# Patient Record
Sex: Female | Born: 1980 | Race: White | Hispanic: No | Marital: Married | Smoking: Never smoker
Health system: Southern US, Community
[De-identification: ages and names within clinical notes are randomized; demographics above are authoritative.]

## PROBLEM LIST (undated history)

## (undated) DIAGNOSIS — K219 Gastro-esophageal reflux disease without esophagitis: Secondary | ICD-10-CM

## (undated) DIAGNOSIS — Z789 Other specified health status: Secondary | ICD-10-CM

## (undated) HISTORY — PX: WISDOM TOOTH EXTRACTION: SHX21

## (undated) HISTORY — DX: Gastro-esophageal reflux disease without esophagitis: K21.9

---

## 2013-10-12 ENCOUNTER — Ambulatory Visit: Payer: Self-pay | Admitting: Family Medicine

## 2013-10-20 LAB — OB RESULTS CONSOLE RPR: RPR: NONREACTIVE

## 2013-10-20 LAB — OB RESULTS CONSOLE RUBELLA ANTIBODY, IGM: RUBELLA: IMMUNE

## 2013-10-20 LAB — OB RESULTS CONSOLE HEPATITIS B SURFACE ANTIGEN: Hepatitis B Surface Ag: NEGATIVE

## 2013-10-20 LAB — OB RESULTS CONSOLE HIV ANTIBODY (ROUTINE TESTING): HIV: NONREACTIVE

## 2013-11-11 ENCOUNTER — Ambulatory Visit (INDEPENDENT_AMBULATORY_CARE_PROVIDER_SITE_OTHER): Payer: 59 | Admitting: Internal Medicine

## 2013-11-11 ENCOUNTER — Encounter: Payer: Self-pay | Admitting: Internal Medicine

## 2013-11-11 VITALS — BP 120/68 | HR 121 | Temp 98.1°F | Ht 67.0 in | Wt 113.0 lb

## 2013-11-11 DIAGNOSIS — Z0000 Encounter for general adult medical examination without abnormal findings: Secondary | ICD-10-CM

## 2013-11-11 DIAGNOSIS — H547 Unspecified visual loss: Secondary | ICD-10-CM

## 2013-11-11 NOTE — Progress Notes (Signed)
Pre visit review using our clinic review tool, if applicable. No additional management support is needed unless otherwise documented below in the visit note. 

## 2013-12-06 ENCOUNTER — Encounter: Payer: Self-pay | Admitting: Internal Medicine

## 2013-12-06 NOTE — Progress Notes (Signed)
   Subjective:    Patient ID: Linda Sims, female    DOB: July 08, 1980, 33 y.o.   MRN: [id number]  HPI  New pt The patient is here for a wellness exam. The patient has been doing well overall without major physical or psychological issues going on lately. She is pregnant.  Review of Systems  Constitutional: Negative for fever, chills, diaphoresis, activity change, appetite change, fatigue and unexpected weight change.  HENT: Negative for congestion, dental problem, ear pain, hearing loss, mouth sores, postnasal drip, sinus pressure, sneezing, sore throat and voice change.   Eyes: Negative for pain and visual disturbance.  Respiratory: Negative for cough, chest tightness, wheezing and stridor.   Cardiovascular: Negative for chest pain, palpitations and leg swelling.  Gastrointestinal: Negative for nausea, vomiting, abdominal pain, blood in stool, abdominal distention and rectal pain.  Genitourinary: Negative for dysuria, frequency, hematuria, decreased urine volume, vaginal bleeding, vaginal discharge, difficulty urinating, vaginal pain and menstrual problem.  Musculoskeletal: Negative for arthralgias, back pain, gait problem, joint swelling and neck pain.  Skin: Negative for color change, rash and wound.  Neurological: Negative for dizziness, tremors, syncope, speech difficulty, weakness and light-headedness.  Hematological: Negative for adenopathy.  Psychiatric/Behavioral: Negative for suicidal ideas, hallucinations, behavioral problems, confusion, sleep disturbance, dysphoric mood and decreased concentration. The patient is not nervous/anxious and is not hyperactive.        Objective:   Physical Exam  Constitutional: She appears well-developed. No distress.  HENT:  Head: Normocephalic.  Right Ear: External ear normal.  Left Ear: External ear normal.  Nose: Nose normal.  Mouth/Throat: Oropharynx is clear and moist.  Eyes: Conjunctivae are normal. Pupils are equal, round, and reactive  to light. Right eye exhibits no discharge. Left eye exhibits no discharge.  Neck: Normal range of motion. Neck supple. No JVD present. No tracheal deviation present. No thyromegaly present.  Cardiovascular: Normal rate, regular rhythm and normal heart sounds.   Pulmonary/Chest: No stridor. No respiratory distress. She has no wheezes.  Abdominal: Soft. Bowel sounds are normal. She exhibits no distension and no mass. There is no tenderness. There is no rebound and no guarding.  Musculoskeletal: She exhibits no edema and no tenderness.  Lymphadenopathy:    She has no cervical adenopathy.  Neurological: She displays normal reflexes. No cranial nerve deficit. She exhibits normal muscle tone. Coordination normal.  Skin: No rash noted. No erythema.  Psychiatric: She has a normal mood and affect. Her behavior is normal. Judgment and thought content normal.    Labs reviewed      Assessment & Plan:

## 2013-12-06 NOTE — Assessment & Plan Note (Signed)
We discussed age appropriate health related issues, including available/recomended screening tests and vaccinations. We discussed a need for adhering to healthy diet and exercise. Labs/EKG were reviewed/ordered. All questions were answered.   

## 2014-02-10 LAB — OB RESULTS CONSOLE ANTIBODY SCREEN: Antibody Screen: NEGATIVE

## 2014-02-10 LAB — OB RESULTS CONSOLE ABO/RH: RH Type: NEGATIVE

## 2014-02-21 ENCOUNTER — Inpatient Hospital Stay (HOSPITAL_COMMUNITY): Admission: AD | Admit: 2014-02-21 | Payer: Self-pay | Source: Ambulatory Visit | Admitting: Obstetrics and Gynecology

## 2014-02-22 ENCOUNTER — Encounter: Payer: Self-pay | Admitting: Internal Medicine

## 2014-04-02 LAB — OB RESULTS CONSOLE GBS: GBS: NEGATIVE

## 2014-04-23 NOTE — L&D Delivery Note (Signed)
Pt pushed and "labored down". She developed exhaustion and asked for assistance. She had the VE placed at +_3 station in the OA position. She then delivered one live viable white female infant over a 2nd degree midline epis on the second ctx. There was one pop off. Nuchal cord x 1. Placenta S/I. EBL-400cc. Baby to NBN. Epis closed with 3-0 chromic and 2-0 vicryl.

## 2014-04-29 ENCOUNTER — Encounter (HOSPITAL_COMMUNITY): Payer: Self-pay | Admitting: *Deleted

## 2014-04-29 ENCOUNTER — Inpatient Hospital Stay (HOSPITAL_COMMUNITY)
Admission: AD | Admit: 2014-04-29 | Discharge: 2014-05-01 | DRG: 775 | Disposition: A | Discharge status: Home / Self Care | Payer: BLUE CROSS/BLUE SHIELD | Source: Ambulatory Visit | Attending: Obstetrics and Gynecology | Admitting: Obstetrics and Gynecology

## 2014-04-29 ENCOUNTER — Inpatient Hospital Stay (HOSPITAL_COMMUNITY): Payer: BLUE CROSS/BLUE SHIELD | Admitting: Anesthesiology

## 2014-04-29 ENCOUNTER — Inpatient Hospital Stay (HOSPITAL_COMMUNITY)
Admission: AD | Admit: 2014-04-29 | Discharge: 2014-04-29 | Disposition: A | Discharge status: Home / Self Care | Payer: BLUE CROSS/BLUE SHIELD | Source: Ambulatory Visit | Attending: Obstetrics and Gynecology | Admitting: Obstetrics and Gynecology

## 2014-04-29 DIAGNOSIS — O471 False labor at or after 37 completed weeks of gestation: Secondary | ICD-10-CM | POA: Insufficient documentation

## 2014-04-29 DIAGNOSIS — IMO0001 Reserved for inherently not codable concepts without codable children: Secondary | ICD-10-CM

## 2014-04-29 DIAGNOSIS — Z3480 Encounter for supervision of other normal pregnancy, unspecified trimester: Secondary | ICD-10-CM

## 2014-04-29 DIAGNOSIS — Z3A39 39 weeks gestation of pregnancy: Secondary | ICD-10-CM | POA: Insufficient documentation

## 2014-04-29 HISTORY — DX: Other specified health status: Z78.9

## 2014-04-29 LAB — CBC
HCT: 38.8 % (ref 36.0–46.0)
Hemoglobin: 13.8 g/dL (ref 12.0–15.0)
MCH: 32.5 pg (ref 26.0–34.0)
MCHC: 35.6 g/dL (ref 30.0–36.0)
MCV: 91.3 fL (ref 78.0–100.0)
Platelets: 192 10*3/uL (ref 150–400)
RBC: 4.25 MIL/uL (ref 3.87–5.11)
RDW: 12.3 % (ref 11.5–15.5)
WBC: 17.7 10*3/uL — AB (ref 4.0–10.5)

## 2014-04-29 MED ORDER — LIDOCAINE HCL (PF) 1 % IJ SOLN
INTRAMUSCULAR | Status: DC | PRN
  Administered 2014-04-29 (×2): 5 mL

## 2014-04-29 MED ORDER — OXYCODONE-ACETAMINOPHEN 5-325 MG PO TABS: 1.0000 | ORAL_TABLET | ORAL | Status: DC | PRN

## 2014-04-29 MED ORDER — BUTORPHANOL TARTRATE 1 MG/ML IJ SOLN: 1.0000 mg | INTRAMUSCULAR | Status: DC | PRN

## 2014-04-29 MED ORDER — CITRIC ACID-SODIUM CITRATE 334-500 MG/5ML PO SOLN: 30.0000 mL | ORAL | Status: DC | PRN

## 2014-04-29 MED ORDER — PHENYLEPHRINE 40 MCG/ML (10ML) SYRINGE FOR IV PUSH (FOR BLOOD PRESSURE SUPPORT): 80.0000 ug | PREFILLED_SYRINGE | INTRAVENOUS | Status: DC | PRN

## 2014-04-29 MED ORDER — FENTANYL 2.5 MCG/ML BUPIVACAINE 1/10 % EPIDURAL INFUSION (WH - ANES)
14.0000 mL/h | INTRAMUSCULAR | Status: DC | PRN
  Administered 2014-04-29 – 2014-04-30 (×2): 14 mL/h via EPIDURAL
  Filled 2014-04-29: qty 125

## 2014-04-29 MED ORDER — LACTATED RINGERS IV SOLN: 500.0000 mL | INTRAVENOUS | Status: DC | PRN

## 2014-04-29 MED ORDER — FENTANYL 2.5 MCG/ML BUPIVACAINE 1/10 % EPIDURAL INFUSION (WH - ANES)
INTRAMUSCULAR | Status: AC
  Administered 2014-04-29: 14 mL/h via EPIDURAL
  Filled 2014-04-29: qty 125

## 2014-04-29 MED ORDER — ONDANSETRON HCL 4 MG/2ML IJ SOLN: 4.0000 mg | Freq: Four times a day (QID) | INTRAMUSCULAR | Status: DC | PRN

## 2014-04-29 MED ORDER — LACTATED RINGERS IV SOLN: 500.0000 mL | Freq: Once | INTRAVENOUS | Status: DC

## 2014-04-29 MED ORDER — OXYTOCIN BOLUS FROM INFUSION
500.0000 mL | INTRAVENOUS | Status: DC
  Administered 2014-04-30: 500 mL via INTRAVENOUS

## 2014-04-29 MED ORDER — LIDOCAINE HCL (PF) 1 % IJ SOLN
30.0000 mL | INTRAMUSCULAR | Status: DC | PRN
  Filled 2014-04-29: qty 30

## 2014-04-29 MED ORDER — DIPHENHYDRAMINE HCL 50 MG/ML IJ SOLN: 12.5000 mg | INTRAMUSCULAR | Status: DC | PRN

## 2014-04-29 MED ORDER — EPHEDRINE 5 MG/ML INJ
10.0000 mg | INTRAVENOUS | Status: DC | PRN
Start: 2014-04-29 — End: 2014-04-30

## 2014-04-29 MED ORDER — EPHEDRINE 5 MG/ML INJ: 10.0000 mg | INTRAVENOUS | Status: DC | PRN

## 2014-04-29 MED ORDER — LACTATED RINGERS IV SOLN
INTRAVENOUS | Status: DC
  Administered 2014-04-29: 22:00:00 via INTRAVENOUS
  Administered 2014-04-30: 125 mL via INTRAVENOUS

## 2014-04-29 MED ORDER — PHENYLEPHRINE 40 MCG/ML (10ML) SYRINGE FOR IV PUSH (FOR BLOOD PRESSURE SUPPORT)
PREFILLED_SYRINGE | INTRAVENOUS | Status: AC
  Filled 2014-04-29: qty 20

## 2014-04-29 MED ORDER — ACETAMINOPHEN 325 MG PO TABS: 650.0000 mg | ORAL_TABLET | ORAL | Status: DC | PRN

## 2014-04-29 MED ORDER — OXYCODONE-ACETAMINOPHEN 5-325 MG PO TABS: 2.0000 | ORAL_TABLET | ORAL | Status: DC | PRN

## 2014-04-29 MED ORDER — OXYTOCIN 40 UNITS IN LACTATED RINGERS INFUSION - SIMPLE MED
62.5000 mL/h | INTRAVENOUS | Status: DC
  Administered 2014-04-30: 62.5 mL/h via INTRAVENOUS
  Filled 2014-04-29: qty 1000

## 2014-04-29 NOTE — Anesthesia Procedure Notes (Signed)
Epidural Patient location during procedure: OB Start time: 04/29/2014 10:54 PM  Staffing Anesthesiologist: Rudean Curt Performed by: anesthesiologist   Preanesthetic Checklist Completed: patient identified, site marked, surgical consent, pre-op evaluation, timeout performed, IV checked, risks and benefits discussed and monitors and equipment checked  Epidural Patient position: sitting Prep: site prepped and draped and DuraPrep Patient monitoring: continuous pulse ox and blood pressure Approach: midline Location: L3-L4 Injection technique: LOR air  Needle:  Needle type: Tuohy  Needle gauge: 17 G Needle length: 9 cm and 9 Needle insertion depth: 5 cm cm Catheter type: closed end flexible Catheter size: 19 Gauge Catheter at skin depth: 10 cm Test dose: negative  Assessment Events: blood not aspirated, injection not painful, no injection resistance, negative IV test and no paresthesia  Additional Notes Patient identified.  Risk benefits discussed including failed block, incomplete pain control, headache, nerve damage, paralysis, blood pressure changes, nausea, vomiting, reactions to medication both toxic or allergic, and postpartum back pain.  Patient expressed understanding and wished to proceed.  All questions were answered.  Sterile technique used throughout procedure and epidural site dressed with sterile barrier dressing. No paresthesia or other complications noted.The patient did not experience any signs of intravascular injection such as tinnitus or metallic taste in mouth nor signs of intrathecal spread such as rapid motor block. Please see nursing notes for vital signs.

## 2014-04-29 NOTE — H&P (Signed)
Linda Sims is a 34 y.o. female presenting for painful contraitons  35 yo G1P0 @ 39+3 presents for evaluation of painful contractions. The patient has been having painful contractions since 3 am this morning. She was seen in the office today and was 3/50/-2. She ccame to MAU a few hours later for evaluation and her cervix was unchanged. She was discharged home and now returns with persistant contractions that have become more painful. Her cervix is now 3-4/100/-1 with BBOW. Given the change will admit for labor.  Her pregnancy has been uncomplicated except for Rh negative status. Received rhogam on 02/09/14.  History OB History    Gravida Para Term Preterm AB TAB SAB Ectopic Multiple Living   1              Past Medical History  Diagnosis Date  . Medical history non-contributory    Past Surgical History  Procedure Laterality Date  . No past surgeries     Family History: family history is negative for Alcohol abuse, Arthritis, Asthma, Birth defects, Cancer, COPD, Depression, Diabetes, Drug abuse, Early death, Hearing loss, Heart disease, Hyperlipidemia, Hypertension, Kidney disease, Learning disabilities, Mental illness, Mental retardation, Miscarriages / Stillbirths, Stroke, Vision loss, and Varicose Veins. Social History:  reports that she has never smoked. She does not have any smokeless tobacco history on file. She reports that she does not drink alcohol or use illicit drugs.   Prenatal Transfer Tool  Maternal Diabetes: No Genetic Screening: Normal Maternal Ultrasounds/Referrals: Normal Fetal Ultrasounds or other Referrals:  None Maternal Substance Abuse:  No Significant Maternal Medications:  None Significant Maternal Lab Results:  Lab values include: Rh negative Other Comments:  None  ROS: as above  Dilation: 3.5 Effacement (%): 100 Station: -2, -1 Blood pressure 132/92, pulse 89, temperature 97.7 F (36.5 C), temperature source Oral, resp. rate 18, last menstrual period  07/27/2013. Exam Physical Exam  Prenatal labs: ABO, Rh: A/Negative/-- (10/21 0000) Antibody: Negative (10/21 0000) Rubella: Immune (06/30 0000) RPR: Nonreactive (06/30 0000)  HBsAg: Negative (06/30 0000)  HIV: Non-reactive (06/30 0000)  GBS: Negative (12/11 0000)   Assessment/Plan: 1) Admit 2) Epidural on request    Jayah Balthazar H. 04/29/2014, 9:08 PM

## 2014-04-29 NOTE — MAU Note (Signed)
Contractions every 5 minutes - was seen by MD 3 hours ago, SVE 3.5 cm's.  uc's are stronger, bloody show, denies LOF.

## 2014-04-29 NOTE — Discharge Instructions (Signed)
Natural Childbirth Natural childbirth is going through labor and delivery without any drugs to relieve pain. You also do not use fetal monitors, have a cesarean delivery, or get a sugical cut to enlarge the vaginal opening (episiotomy). With the help of a birthing professional (midwife), you will direct your own labor and delivery as you choose. Many women chose natural childbirth because they feel more in control and in touch with their labor and delivery. They are also concerned about the medications affecting themselves and the baby. Pregnant women with a high risk pregnancy should not attempt natural childbirth. It is better to deliver the infant in a hospital if an emergency situation arises. Sometimes, the caregiver has to intervene for the health and safety of the mother and infant. TWO TECHNIQUES FOR NATURAL CHILDBIRTH:   The Lamaze method. This method teaches women that having a baby is normal, healthy, and natural. It also teaches the mother to take a neutral position regarding pain medication and anesthesia and to make an informed decision if and when it is right for them.  The Hulan Fray (also called husband coached birth). This method teaches the father to be the birth coach and stresses a natural approach. It also encourages exercise and a balanced diet with good nutrition. The exercises teach relaxation and deep breathing techniques. However, there are also classes to prepare the parents for an emergency situation that may occur. METHODS OF DEALING WITH LABOR PAIN AND DELIVERY:  Meditation.  Yoga.  Hypnosis.  Acupuncture.  Massage.  Changing positions (walking, rocking, showering, leaning on birth balls).  Lying in warm water or a jacuzzi.  Find an activity that keeps your mind off of the labor pain.  Listen to soft music.  Visual imagery (focus on a particular object). BEFORE GOING INTO LABOR  Be sure you and your spouse/partner are in agreement to have natural  childbirth.  Decide if your caregiver or a midwife will deliver your baby.  Decide if you will have your baby in the hospital, birthing center, or at home.  If you have children, make plans to have someone to take care of them when you go to the hospital.  Know the distance and the time it takes to go to the delivery center. Make a dry run to be sure.  Have a bag packed with a night gown, bathrobe, and toiletries ready to take when you go into labor.  Keep phone numbers of your family and friends handy if you need to call someone when you go into labor.  Your spouse or partner should go to all the teaching classes.  Talk with your caregiver about the possibility of a medical emergency and what will happen if that occurs. ADVANTAGES OF NATURAL CHILDBIRTH  You are in control of your labor and delivery.  It is safe.  There are no medications or anesthetics that may affect you and the fetus.  There are no invasive procedures such as an episiotomy.  You and your partner will work together, which can increase your bond.  Meditation, yoga, massage, and breathing exercises can be learned while pregnant and help you when you are in labor and at delivery.  In most delivery centers, the family and friends can be involved in the labor and delivery process. DISADVANTAGES OF NATURAL CHILDBIRTH  You will experience pain during your labor and delivery.  The methods of helping relieve your labor pains may not work for you.  You may feel embarrassed, disappointed, and like a failure  if you decide to change your mind during labor and not have natural childbirth. AFTER THE DELIVERY  You will be very tired.  You will be uncomfortable because of your uterus contracting. You will feel soreness around the vagina.  You may feel cold and shaky.This is a natural reaction.  You will be excited, overwhelmed, accomplished, and proud to be a mother. HOME CARE INSTRUCTIONS   Follow the advice and  instructions of your caregiver.  Follow the instructions of your natural childbirth instructor (Lamaze or [place]). Document Released: 03/22/2008 Document Revised: 07/02/2011 Document Reviewed: 12/15/2012 [facility] Patient Information 2015 [place], [place]. This information is not intended to replace advice given to you by your health care provider. Make sure you discuss any questions you have with your health care provider.

## 2014-04-29 NOTE — Anesthesia Preprocedure Evaluation (Signed)
Anesthesia Evaluation  Patient identified by MRN, date of birth, ID band Patient awake    Reviewed: Allergy & Precautions, H&P , Patient's Chart, lab work & pertinent test results  Airway Mallampati: II  TM Distance: >3 FB Neck ROM: full    Dental   Pulmonary  breath sounds clear to auscultation        Cardiovascular Rhythm:regular Rate:Normal     Neuro/Psych    GI/Hepatic   Endo/Other    Renal/GU      Musculoskeletal   Abdominal   Peds  Hematology   Anesthesia Other Findings   Reproductive/Obstetrics (+) Pregnancy                             Anesthesia Physical Anesthesia Plan  ASA: II  Anesthesia Plan: Epidural   Post-op Pain Management:    Induction:   Airway Management Planned:   Additional Equipment:   Intra-op Plan:   Post-operative Plan:   Informed Consent: I have reviewed the patients History and Physical, chart, labs and discussed the procedure including the risks, benefits and alternatives for the proposed anesthesia with the patient or authorized representative who has indicated his/her understanding and acceptance.     Plan Discussed with:   Anesthesia Plan Comments:         Anesthesia Quick Evaluation

## 2014-04-29 NOTE — MAU Note (Signed)
Pt reports ctx stronger and closer together. Here earlier this afternoon for labor eval. Denies SROM or bleeding at this time

## 2014-04-29 NOTE — MAU Note (Signed)
Urine in lab 

## 2014-04-30 ENCOUNTER — Encounter (HOSPITAL_COMMUNITY): Payer: Self-pay | Admitting: *Deleted

## 2014-04-30 DIAGNOSIS — Z3480 Encounter for supervision of other normal pregnancy, unspecified trimester: Secondary | ICD-10-CM

## 2014-04-30 MED ORDER — ZOLPIDEM TARTRATE 5 MG PO TABS
5.0000 mg | ORAL_TABLET | Freq: Every evening | ORAL | Status: DC | PRN
Start: 2014-04-30 — End: 2014-05-01

## 2014-04-30 MED ORDER — WITCH HAZEL-GLYCERIN EX PADS: 1.0000 "application " | MEDICATED_PAD | CUTANEOUS | Status: DC | PRN

## 2014-04-30 MED ORDER — ONDANSETRON HCL 4 MG PO TABS: 4.0000 mg | ORAL_TABLET | ORAL | Status: DC | PRN

## 2014-04-30 MED ORDER — SENNOSIDES-DOCUSATE SODIUM 8.6-50 MG PO TABS
2.0000 | ORAL_TABLET | ORAL | Status: DC
  Administered 2014-05-01: 2 via ORAL
  Filled 2014-04-30: qty 2

## 2014-04-30 MED ORDER — TERBUTALINE SULFATE 1 MG/ML IJ SOLN: 0.2500 mg | Freq: Once | INTRAMUSCULAR | Status: DC | PRN

## 2014-04-30 MED ORDER — IBUPROFEN 600 MG PO TABS
600.0000 mg | ORAL_TABLET | Freq: Four times a day (QID) | ORAL | Status: DC
  Administered 2014-04-30 – 2014-05-01 (×4): 600 mg via ORAL
  Filled 2014-04-30 (×4): qty 1

## 2014-04-30 MED ORDER — DIBUCAINE 1 % RE OINT: 1.0000 "application " | TOPICAL_OINTMENT | RECTAL | Status: DC | PRN

## 2014-04-30 MED ORDER — MEASLES, MUMPS & RUBELLA VAC ~~LOC~~ INJ: 0.5000 mL | INJECTION | Freq: Once | SUBCUTANEOUS | Status: DC

## 2014-04-30 MED ORDER — OXYTOCIN 40 UNITS IN LACTATED RINGERS INFUSION - SIMPLE MED
1.0000 m[IU]/min | INTRAVENOUS | Status: DC
  Administered 2014-04-30: 2 m[IU]/min via INTRAVENOUS

## 2014-04-30 MED ORDER — OXYCODONE-ACETAMINOPHEN 5-325 MG PO TABS: 2.0000 | ORAL_TABLET | ORAL | Status: DC | PRN

## 2014-04-30 MED ORDER — SIMETHICONE 80 MG PO CHEW
80.0000 mg | CHEWABLE_TABLET | ORAL | Status: DC | PRN
Start: 2014-04-30 — End: 2014-05-01

## 2014-04-30 MED ORDER — BENZOCAINE-MENTHOL 20-0.5 % EX AERO
1.0000 "application " | INHALATION_SPRAY | CUTANEOUS | Status: DC | PRN
  Filled 2014-04-30: qty 56

## 2014-04-30 MED ORDER — ONDANSETRON HCL 4 MG/2ML IJ SOLN: 4.0000 mg | INTRAMUSCULAR | Status: DC | PRN

## 2014-04-30 MED ORDER — TETANUS-DIPHTH-ACELL PERTUSSIS 5-2.5-18.5 LF-MCG/0.5 IM SUSP: 0.5000 mL | Freq: Once | INTRAMUSCULAR | Status: DC

## 2014-04-30 MED ORDER — OXYCODONE-ACETAMINOPHEN 5-325 MG PO TABS
1.0000 | ORAL_TABLET | ORAL | Status: DC | PRN
  Administered 2014-04-30: 1 via ORAL
  Filled 2014-04-30: qty 1

## 2014-04-30 NOTE — Progress Notes (Signed)
Dr. Harrington Challenger would like patient to labor down if tolerated.

## 2014-04-30 NOTE — Progress Notes (Signed)
Infant skin to skin with mom

## 2014-04-30 NOTE — Progress Notes (Signed)
Infant skin to skin with father

## 2014-05-01 LAB — RPR: RPR: NONREACTIVE — AB

## 2014-05-01 MED ORDER — OXYCODONE-ACETAMINOPHEN 5-325 MG PO TABS: 2.0000 | ORAL_TABLET | ORAL | Status: DC | PRN

## 2014-05-01 NOTE — Lactation Note (Signed)
This note was copied from the chart of Linda Andorra. Lactation Consultation Note; Initial visit with mom. She is latching baby as I went into room. Reports some tenderness but baby has been nursing a lot. Reports she has been leaking Colostrum for several Linda Sims. BF brochure given with resources for support after DC. No questions at present. To call prn  Patient Name: Linda Sims Today's Date: 05/01/2014 Reason for consult: Initial assessment   Maternal Data Formula Feeding for Exclusion: No Has patient been taught Hand Expression?: Yes Does the patient have breastfeeding experience prior to this delivery?: No  Feeding Feeding Type: Breast Fed Length of feed: 30 min  LATCH Score/Interventions Latch: Grasps breast easily, tongue down, lips flanged, rhythmical sucking.  Audible Swallowing: A few with stimulation  Type of Nipple: Everted at rest and after stimulation  Comfort (Breast/Nipple): Soft / non-tender     Hold (Positioning): Assistance needed to correctly position infant at breast and maintain latch.  LATCH Score: 8  Lactation Tools Discussed/Used     Consult Status Consult Status: Complete    Truddie Crumble 05/01/2014, 10:47 AM

## 2014-05-01 NOTE — Progress Notes (Signed)
PPD#1 Pt without complaints. She wants to go home. States Peds gave the ok. Lochia - mild VSSAF IMP/ Doing well PLAN/ Will discharge to home.

## 2014-05-01 NOTE — Progress Notes (Signed)
Patient given and educated on use of sitz bath.

## 2014-05-01 NOTE — Anesthesia Postprocedure Evaluation (Signed)
Anesthesia Post Note  Patient: Linda Sims  Procedure(s) Performed: * No procedures listed *  Anesthesia type: Epidural  Patient location: Mother/Baby  Post pain: Pain level controlled  Post assessment: Post-op Vital signs reviewed  Last Vitals:  Filed Vitals:   05/01/14 0635  BP: 99/57  Pulse: 74  Temp: 36.4 C  Resp: 18    Post vital signs: Reviewed  Level of consciousness: awake  Complications: No apparent anesthesia complications

## 2014-05-01 NOTE — Discharge Summary (Signed)
Obstetric Discharge Summary Reason for Admission: onset of labor Prenatal Procedures: ultrasound Intrapartum Procedures: vacuum Postpartum Procedures: none Complications-Operative and Postpartum: 2nd degree perineal laceration HEMOGLOBIN  Date Value Ref Range Status  04/29/2014 13.8 12.0 - 15.0 g/dL Final   HCT  Date Value Ref Range Status  04/29/2014 38.8 36.0 - 46.0 % Final    Physical Exam:  General: alert Lochia: appropriate Uterine Fundus: firm   Discharge Diagnoses: Term Pregnancy-delivered  Discharge Information: Date: 05/01/2014 Activity: pelvic rest Diet: routine Medications: PNV, Ibuprofen and Percocet Condition: stable Instructions: refer to practice specific booklet Discharge to: home Follow-up Information    Follow up with Olga Millers, MD. Schedule an appointment as soon as possible for a visit in 1 month.   Specialty:  Obstetrics and Gynecology   Contact information:   [place] [address] [phone]       Newborn Data: Live born female  Birth Weight: 6 lb 6.8 oz (2914 g) APGAR: 8, 9  Home with mother.  ANDERSON,MARK E 05/01/2014, 11:06 AM

## 2014-05-02 ENCOUNTER — Ambulatory Visit: Payer: Self-pay

## 2014-05-02 NOTE — Lactation Note (Signed)
This note was copied from the chart of Linda Andorra. Lactation Consultation Note  Patient Name: Linda Sims [id number] Date: 05/02/2014 Reason for consult: Follow-up assessment;Infant weight loss   Follow-up consult at 40 hours age; infant has had an 8% weight loss from BW.  Infant has breastfed x17 (15-45 ml) in past 24 hours; voids-1 in past 24 hours/ 3 life; stools -9 in 24 hours/ 12 life.   Peds Provider requested supplementing with 15 ml formula or EBM if available with each feeding using an SNS; infant will follow-up with Peds tomorrow.   When Fabens entered room infant had been feeding for 15 minutes on left side in cradle position.  Mom demonstrated hand expression with observation of colostrum easily expressed from breast.  Parents consented to trying SNS at current feeding for teaching purposes even though baby was sleepy.   LC taught dad how to set up SNS with dad setting it up himself and discussed how to clean SNS.  Parents were able to re-awaken infant to get latched for SNS feeding.   Mom was latching using a cradle hold on left side and shallow latch.  Taught mom how to use cross-cradle hold, sandwiching of breast, and discussed importance of latching with depth with flanged lips and wide mouth.  Educated on importance of depth in transferring milk and thus maintaining milk supply.  Infant took 37ml of Alimentum formula via SNS and then went to sleep.    Mom has DEBP at home; encouraged pumping after each feeding during the day and using EBM pumped for next SNS supplementation as milk volume increases..  Parents feel good about supplementation feeding plan and will follow-up with peds tomorrow.  Encouraged to call for questions as needed after discharge.  Informed of outpatient consultation services if needed.     Maternal Data    Feeding Feeding Type: Formula Length of feed: 25 min  LATCH Score/Interventions Latch: Grasps breast easily, tongue down, lips flanged, rhythmical  sucking.  Audible Swallowing: A few with stimulation  Type of Nipple: Everted at rest and after stimulation  Comfort (Breast/Nipple): Filling, red/small blisters or bruises, mild/mod discomfort  Problem noted: Mild/Moderate discomfort Interventions (Mild/moderate discomfort): Hand expression  Hold (Positioning): No assistance needed to correctly position infant at breast. Intervention(s): Breastfeeding basics reviewed  LATCH Score: 8  Lactation Tools Discussed/Used Pump Review: Setup, frequency, and cleaning;Milk Storage   Consult Status Consult Status: Complete    Merlene Laughter 05/02/2014, 12:40 PM

## 2014-05-03 LAB — TYPE AND SCREEN
ABO/RH(D): A NEG
Antibody Screen: POSITIVE
Unit division: 0
Unit division: 0

## 2014-05-04 ENCOUNTER — Ambulatory Visit (HOSPITAL_COMMUNITY)
Admission: RE | Admit: 2014-05-04 | Discharge: 2014-05-04 | Disposition: A | Discharge status: Home / Self Care | Payer: BLUE CROSS/BLUE SHIELD | Source: Ambulatory Visit | Attending: Obstetrics and Gynecology | Admitting: Obstetrics and Gynecology

## 2014-05-04 NOTE — Lactation Note (Signed)
Lactation Consult  Mother's reason for visit:  Feeding assessment Visit Type:  OP Appointment Notes:  Linda Sims and Linda Sims are here today for a feeding assessment.  Linda Sims has resolving jaundiced and mom has resolving engorgement.  Maria ate on the left side first and quickly became sleepy.  She did not soften the breast but ate 24 ml.  She was placed on the right breast in a cross cradle hold. She suckled much deeper this position and transferred 18 ml.  She was still rooting so we repositioned her in a football position on the left breast and she transferred another 12 ml. Longer jaw excursions were noted with this position and she took more milk from the breast.  I talked to mom about laid back nursing and tummy time to help baby stretch any upper body muscles that may be affecting suckling. We discussed using jaw massage, laid back nursing and tummy time to help with breast feeding.  I also recommended that mom post-pump 4 times a day to aid in optimal milk production.  Her upper lip frenum does have some restriction. Support group encouraged for weight checks Consult:  Initial Lactation Consultant:  Van Clines  ________________________________________________________________________  [name] Name: Curt Jews Date of Birth: 04/30/2012 Pediatrician: Karsten Ro Gender: female Gestational Age: [redacted]w[redacted]d (At Birth) Birth Weight: 6 lb 6.8 oz (2914 g) Weight at Discharge: Weight: 5 lb 15 oz (2693 g)Date of Discharge: 05/02/2014 Filed Weights   04/30/14 1057 05/01/14 0000 05/02/14 0813  Weight: 6 lb 6.8 oz (2914 g) 6 lb 5.4 oz (2875 g) 5 lb 15 oz (2693 g)   Last weight taken from location outside of [facility]: 6+2 Location:Pediatrician's office Weight today: 2846 prefeed 6# 4 oz at 4 days of life     2870 post feed  ________________________________________________________________________  Mother's Name: Mendel Corning Type of delivery:  Vaginal Breastfeeding  Experience:  First baby Maternal Medical Conditions:  NA Maternal Medications:  PNV, Advils  ________________________________________________________________________  Breastfeeding History (Post Discharge)  Frequency of breastfeeding:  Every 2 - 2.5 hours Infant Intake and Output Assessment  Voids:  4-5 in 24 hrs.  Color:  Clear yellow Stools:  10 in 24 hrs.  Color:  Yellow  ________________________________________________________________________  Maternal Breast Assessment  Breast:  Full Nipple:  Erect and Scabs Pain level:  0 when wearing shells Pain interventions:  Inverted shells  _______________________________________________________________________

## 2014-05-07 ENCOUNTER — Ambulatory Visit (HOSPITAL_COMMUNITY): Payer: BLUE CROSS/BLUE SHIELD

## 2014-05-19 ENCOUNTER — Ambulatory Visit (HOSPITAL_COMMUNITY)
Admission: RE | Admit: 2014-05-19 | Discharge: 2014-05-19 | Disposition: A | Discharge status: Home / Self Care | Payer: BLUE CROSS/BLUE SHIELD | Source: Ambulatory Visit | Attending: Obstetrics and Gynecology | Admitting: Obstetrics and Gynecology

## 2014-05-19 NOTE — Lactation Note (Signed)
Lactation Consult  Mother's reason for visit: per mom milk is stuck on the breast , right breast isn't emptying, and feels full after feeding .  Per mom today's temp 99.5 at 12n , at 3pm 101 , and yesterday 102. , chills for the last 3 days, denies flu like symptoms. And headache for 2 days. Plugged started 2 days ago in the evening.  Visit Type: Feeding assessment due to plugged duct  Appointment Notes: Plugged duct left breast , resolving,  Consult:  Followup  Lactation Consultant:  Myer Haff  ________________________________________________________________________ Linda Sims Name: Linda Sims Date of Birth: 04/30/2012 Pediatrician:Dr. Karsten Ro  Gender: female Gestational Age: [redacted]w[redacted]d (At Birth) Birth Weight: 6 lb 6.8 oz (2914 g) Weight at Discharge: Weight: 5 lb 15 oz (2693 g)Date of Discharge: 05/02/2014 Filed Weights   04/30/14 1057 05/01/14 0000 05/02/14 0813  Weight: 6 lb 6.8 oz (2914 g) 6 lb 5.4 oz (2875 g) 5 lb 15 oz (2693 g)   Last weight taken from location outside of [facility]: 7-2 oz 1/21  Location:Pediatrician's office Weight today:3480 g , 7-10 .7 oz     ________________________________________________________________________  Mother's Name: Mendel Corning Type of delivery:  Vaginal Delivery  Breastfeeding Experience:  2 1/2 weeks , 1st time mother  Maternal Medical Conditions:  No risk  Maternal Medications:  PNV   ________________________________________________________________________  Breastfeeding History (Post Discharge)  Frequency of breastfeeding: every 2 to 2 1/2 hours  Duration of feeding:  20 -30 mins   Supplementing; none   Pumping : per mom DEBP - 3-4 times per day after feedings , 1-2 oz at one pumping session.  Infant Intake and Output Assessment  Voids:  10-15 in 24 hClear yellow Stools:  10 -12 hrs.  Color:  Yellow  ________________________________________________________________________  Maternal  Breast Assessment - both breast full , nipples appear healthy pink, no plugs noted on the left .                                                       On the right breast noted a warm to hot pinkish area on the 10 'oclock area above the areola                                                        Per mom and dad the pinkish area was from where mom was placing a very warm wash cloth on the breast to increase let down.                                                       Pinkish area still warm after the feeding , also LC noted a plug on the underneath portion of the breast mid-lateral aspect .  Per mom very tender to touch when LC was assessing breast. Mom had mentioned the resolving plug was on the inner aspect of the                                                        Near the 3 o'clock position. Has resolved , no sign of plug , or any pink reddened area.                                                       LC 's concerns is mom having a HA for 2 days , plugged ducts for 2 days, and an elevated Temp up to 102 and chills.  Breast:  Full Nipple:  Erect Pain level:  0 Pain interventions:  Expressed breast milk  _______________________________________________________________________ Feeding Assessment/Evaluation  Initial feeding assessment: Baby alert and calm, content.   Infant's oral assessment:  Variance- short labial frenulum above gum line and upper lip stretches well to flanged position. Short anterior frenulum and high palate.    Positioning:  Football Right breast  LATCH documentation:  Latch:  2 = Grasps breast easily, tongue down, lips flanged, rhythmical sucking.  Audible swallowing:  2 = Spontaneous and intermittent  Type of nipple:  2 = Everted at rest and after stimulation  Comfort (Breast/Nipple):  1 = Filling, red/small blisters or bruises, mild/mod discomfort  Hold (Positioning):  1 = Assistance needed to  correctly position infant at breast and maintain latch  LATCH score:  8  Attached assessment:  Deep  Lips flanged:  Yes.    Lips untucked:  Yes.    Suck assessment:  Nutritive  Tools:  None  Instructed on use and cleaning of tool:  No.  Pre-feed weight:  3480 g , 7-10.7 oz  Post-feed weight:  3498 g , 7-11.4 oz  Amount transferred:  18 ml  , ( off the right breast ) , per mom also fed the baby 10 mins at 315 p , 45 mins prior to [place] apt. Amount supplemented:  None   Additional Feeding Assessment -   Infant's oral assessment:  Variance, see above note   Positioning:  Football Left breast  LATCH documentation:  Latch:  2 = Grasps breast easily, tongue down, lips flanged, rhythmical sucking.  Audible swallowing:  2 = Spontaneous and intermittent  Type of nipple:  1 = Flat  Comfort (Breast/Nipple):  1 = Filling, red/small blisters or bruises, mild/mod discomfort  Hold (Positioning):  2 = No assistance needed to correctly position infant at breast  LATCH score:  8   Attached assessment:  Shallow , LC assisted with depth , increased swallows noted   Lips flanged:  Yes.    Lips untucked:  Yes.    Suck assessment:  Nutritive  Tools:  None  Instructed on use and cleaning of tool:  No.  Pre-feed weight:  3498 g , 7-11.4 oz  Post-feed weight:  3566 g, 7-13.8 oz  Amount transferred:  68 ml  Amount supplemented:  None    Total amount pumped post feed:  None needed - Baby breast fed both breast   Total amount transferred: 40  ml  Total supplement given:  None   Lactation Impression:  Breast feeding - latching wises is going well both breast ( even though baby has a short frenulum ) and gaining well. Mom has a quick Let down , and the baby stays in a consistent swallowing pattern , 86  ml transferred  off the breast at consult ( only 18 ml off the right effected area , and 68 ml off the left )  Questionable how long the plugged duct issue has been going on to see such a  difference in transfer. Due to moms S/S , especially the elevated temp , chills - feel this mom should be tx for mastitis. Praised mom  For calling the [facility] office early for apt to have issue assessed.   Lactation Plan of care Continue to feed every 2-3 hours and with feeding cues. Growth spurts 2 1/2 - 3 weeks , 6 weeks , - cluster feeding is normal  Breast feed at least 2 different positions  Prevention of engorgement , plugged ducts , and tx of Mastitis  Tx - of mastitis - DR. TO CALL IN ANTIBIOTIC PRESCRIPTION FOR 10 DAYS , IF TEMP DOESN'T DECREASE IN 55 HOURS - CALL DR, OFFICE BACK. iF BREAST ARE FULL - FEED THE BABY , IF TO FULL HAND EXPRESS, FEED THE BABY , ALWAYS SOFTEN 1ST BREAST WELL PRIOR TO OFFERING THE 2ND BREAST  BREAST GREATER THAN FULL , FEELING TIGHT , NEED TO RELEASE BEFORE FEEDING HAND EXPRESSING OR PUMPING OFF 3-5 MINS . Breast are firm to hard need to ice 15 -20 mins then massage , hand express, pre-pump if needed and feed the baby.  If the breast are not letting down well , warm wash cloth above areola while the baby is feeding ( just warm not hot )  Pump as needed

## 2015-03-22 ENCOUNTER — Encounter: Payer: BLUE CROSS/BLUE SHIELD | Admitting: Internal Medicine

## 2015-04-04 ENCOUNTER — Ambulatory Visit (INDEPENDENT_AMBULATORY_CARE_PROVIDER_SITE_OTHER): Payer: BLUE CROSS/BLUE SHIELD | Admitting: Internal Medicine

## 2015-04-04 ENCOUNTER — Other Ambulatory Visit (INDEPENDENT_AMBULATORY_CARE_PROVIDER_SITE_OTHER): Payer: BLUE CROSS/BLUE SHIELD

## 2015-04-04 ENCOUNTER — Encounter: Payer: Self-pay | Admitting: Internal Medicine

## 2015-04-04 VITALS — BP 114/72 | HR 81 | Ht 67.0 in | Wt 122.0 lb

## 2015-04-04 DIAGNOSIS — Z0000 Encounter for general adult medical examination without abnormal findings: Secondary | ICD-10-CM

## 2015-04-04 DIAGNOSIS — Z23 Encounter for immunization: Secondary | ICD-10-CM

## 2015-04-04 LAB — URINALYSIS
Bilirubin Urine: NEGATIVE
Hgb urine dipstick: NEGATIVE
KETONES UR: NEGATIVE
LEUKOCYTES UA: NEGATIVE
Nitrite: NEGATIVE
Total Protein, Urine: NEGATIVE
UROBILINOGEN UA: 0.2 (ref 0.0–1.0)
Urine Glucose: NEGATIVE
pH: 6 (ref 5.0–8.0)

## 2015-04-04 LAB — HEPATIC FUNCTION PANEL
ALK PHOS: 37 U/L — AB (ref 39–117)
ALT: 14 U/L (ref 0–35)
AST: 14 U/L (ref 0–37)
Albumin: 4.6 g/dL (ref 3.5–5.2)
BILIRUBIN DIRECT: 0.1 mg/dL (ref 0.0–0.3)
Total Bilirubin: 0.6 mg/dL (ref 0.2–1.2)
Total Protein: 7 g/dL (ref 6.0–8.3)

## 2015-04-04 LAB — CBC WITH DIFFERENTIAL/PLATELET
Basophils Absolute: 0 10*3/uL (ref 0.0–0.1)
Basophils Relative: 0.3 % (ref 0.0–3.0)
Eosinophils Absolute: 0 10*3/uL (ref 0.0–0.7)
Eosinophils Relative: 0.6 % (ref 0.0–5.0)
HEMATOCRIT: 40.6 % (ref 36.0–46.0)
Hemoglobin: 13.8 g/dL (ref 12.0–15.0)
LYMPHS ABS: 2.4 10*3/uL (ref 0.7–4.0)
LYMPHS PCT: 36 % (ref 12.0–46.0)
MCHC: 33.9 g/dL (ref 30.0–36.0)
MCV: 90.9 fl (ref 78.0–100.0)
MONOS PCT: 8.5 % (ref 3.0–12.0)
Monocytes Absolute: 0.6 10*3/uL (ref 0.1–1.0)
NEUTROS ABS: 3.7 10*3/uL (ref 1.4–7.7)
Neutrophils Relative %: 54.6 % (ref 43.0–77.0)
PLATELETS: 220 10*3/uL (ref 150.0–400.0)
RBC: 4.47 Mil/uL (ref 3.87–5.11)
RDW: 12.2 % (ref 11.5–15.5)
WBC: 6.8 10*3/uL (ref 4.0–10.5)

## 2015-04-04 LAB — BASIC METABOLIC PANEL
BUN: 11 mg/dL (ref 6–23)
CALCIUM: 9.8 mg/dL (ref 8.4–10.5)
CO2: 31 mEq/L (ref 19–32)
Chloride: 106 mEq/L (ref 96–112)
Creatinine, Ser: 0.98 mg/dL (ref 0.40–1.20)
GFR: 69.8 mL/min (ref >60.00)
Glucose, Bld: 98 mg/dL (ref 70–99)
Potassium: 5 mEq/L (ref 3.5–5.1)
SODIUM: 142 meq/L (ref 135–145)

## 2015-04-04 LAB — TSH: TSH: 0.83 u[IU]/mL (ref 0.35–4.50)

## 2015-04-04 MED ORDER — VITAMIN D3 50 MCG (2000 UT) PO CAPS: 2000.0000 [IU] | ORAL_CAPSULE | Freq: Every day | ORAL | Status: DC

## 2015-04-04 NOTE — Progress Notes (Signed)
Pre visit review using our clinic review tool, if applicable. No additional management support is needed unless otherwise documented below in the visit note. 

## 2015-04-04 NOTE — Assessment & Plan Note (Signed)
We discussed age appropriate health related issues, including available/recomended screening tests and vaccinations. We discussed a need for adhering to healthy diet and exercise.All questions were answered.    

## 2015-04-04 NOTE — Progress Notes (Signed)
Subjective:  Patient ID: Linda Sims, female    DOB: [birth year]  Age: 34 y.o. MRN: [id number]  CC: Annual Exam   HPI Linda Sims presents for a well exam  Outpatient Prescriptions Prior to Visit  Medication Sig Dispense Refill  . Omega-3 Fatty Acids (FISH OIL PO) Take 1 capsule by mouth daily.    Marland Kitchen oxyCODONE-acetaminophen (PERCOCET/ROXICET) 5-325 MG per tablet Take 2 tablets by mouth every 4 (four) hours as needed (for pain scale equal to or greater than 7). 30 tablet 0  . Prenatal Vit-Fe Fumarate-FA (PRENATAL PO) Take 1 tablet by mouth daily.      No facility-administered medications prior to visit.    ROS Review of Systems  Constitutional: Negative for chills, activity change, appetite change, fatigue and unexpected weight change.  HENT: Negative for congestion, mouth sores and sinus pressure.   Eyes: Negative for visual disturbance.  Respiratory: Negative for cough, chest tightness and shortness of breath.   Gastrointestinal: Negative for nausea and abdominal pain.  Genitourinary: Negative for frequency, difficulty urinating and vaginal pain.  Musculoskeletal: Negative for back pain and gait problem.  Skin: Negative for pallor and rash.  Neurological: Negative for dizziness, tremors, weakness, numbness and headaches.  Psychiatric/Behavioral: Negative for suicidal ideas, confusion and sleep disturbance. The patient is not nervous/anxious.     Objective:  BP 114/72 mmHg  Pulse 81  Ht 5\' 7"  (1.702 m)  Wt 122 lb (55.339 kg)  BMI 19.10 kg/m2  SpO2 97%  BP Readings from Last 3 Encounters:  04/04/15 114/72  05/01/14 99/57  04/29/14 138/87    Wt Readings from Last 3 Encounters:  04/04/15 122 lb (55.339 kg)  04/29/14 144 lb 3.2 oz (65.409 kg)  11/11/13 113 lb (51.256 kg)    Physical Exam  Constitutional: She appears well-developed. No distress.  HENT:  Head: Normocephalic.  Right Ear: External ear normal.  Left Ear: External ear normal.  Nose: Nose normal.    Mouth/Throat: Oropharynx is clear and moist.  Eyes: Conjunctivae are normal. Pupils are equal, round, and reactive to light. Right eye exhibits no discharge. Left eye exhibits no discharge.  Neck: Normal range of motion. Neck supple. No JVD present. No tracheal deviation present. No thyromegaly present.  Cardiovascular: Normal rate, regular rhythm and normal heart sounds.   Pulmonary/Chest: No stridor. No respiratory distress. She has no wheezes.  Abdominal: Soft. Bowel sounds are normal. She exhibits no distension and no mass. There is no tenderness. There is no rebound and no guarding.  Musculoskeletal: She exhibits no edema or tenderness.  Lymphadenopathy:    She has no cervical adenopathy.  Neurological: She displays normal reflexes. No cranial nerve deficit. She exhibits normal muscle tone. Coordination normal.  Skin: No rash noted. No erythema.  Psychiatric: She has a normal mood and affect. Her behavior is normal. Judgment and thought content normal.    Lab Results  Component Value Date   WBC 6.8 04/04/2015   HGB 13.8 04/04/2015   HCT 40.6 04/04/2015   PLT 220.0 04/04/2015   GLUCOSE 98 04/04/2015   ALT 14 04/04/2015   AST 14 04/04/2015   NA 142 04/04/2015   K 5.0 04/04/2015   CL 106 04/04/2015   CREATININE 0.98 04/04/2015   BUN 11 04/04/2015   CO2 31 04/04/2015   TSH 0.83 04/04/2015    No results found.  Assessment & Plan:   Julee was seen today for annual exam.  Diagnoses and all orders for this visit:  Well adult exam -  Basic metabolic panel; Future -     Hepatic function panel; Future -     CBC with Differential/Platelet; Future -     TSH; Future -     Urinalysis; Future  Need for influenza vaccination -     Flu Vaccine QUAD 36+ mos IM  Other orders -     Cholecalciferol (VITAMIN D3) 2000 UNITS capsule; Take 1 capsule (2,000 Units total) by mouth daily.  I have discontinued Ms. Tuckett's oxyCODONE-acetaminophen. I am also having her start on Vitamin  D3. Additionally, I am having her maintain her Prenatal Vit-Fe Fumarate-FA (PRENATAL PO) and Omega-3 Fatty Acids (FISH OIL PO).  Meds ordered this encounter  Medications  . Cholecalciferol (VITAMIN D3) 2000 UNITS capsule    Sig: Take 1 capsule (2,000 Units total) by mouth daily.    Dispense:  100 capsule    Refill:  3     Follow-up: Return in about 1 year (around 04/03/2016) for Wellness Exam.  Walker Kehr, MD

## 2016-03-06 ENCOUNTER — Ambulatory Visit (INDEPENDENT_AMBULATORY_CARE_PROVIDER_SITE_OTHER): Payer: BLUE CROSS/BLUE SHIELD

## 2016-03-06 DIAGNOSIS — Z23 Encounter for immunization: Secondary | ICD-10-CM | POA: Diagnosis not present

## 2016-04-04 ENCOUNTER — Encounter: Payer: BLUE CROSS/BLUE SHIELD | Admitting: Internal Medicine

## 2018-06-17 LAB — HM PAP SMEAR: HM Pap smear: NORMAL

## 2018-06-19 ENCOUNTER — Encounter: Payer: Self-pay | Admitting: Internal Medicine

## 2018-06-19 NOTE — Progress Notes (Unsigned)
Outside notes received. Information abstracted. Notes sent to scan.  

## 2019-01-29 ENCOUNTER — Other Ambulatory Visit: Payer: Self-pay

## 2019-01-29 DIAGNOSIS — Z20822 Contact with and (suspected) exposure to covid-19: Secondary | ICD-10-CM

## 2019-01-29 DIAGNOSIS — Z20828 Contact with and (suspected) exposure to other viral communicable diseases: Secondary | ICD-10-CM | POA: Diagnosis not present

## 2019-01-30 LAB — NOVEL CORONAVIRUS, NAA: SARS-CoV-2, NAA: NOT DETECTED

## 2019-05-06 ENCOUNTER — Telehealth: Payer: Self-pay

## 2019-05-06 NOTE — Telephone Encounter (Signed)
Please advise about pt re-establishing care

## 2019-05-06 NOTE — Telephone Encounter (Signed)
Copied from [place] [id number]. Topic: General - Other >> May 06, 2019  1:46 PM Celene Kras wrote: Reason for CRM: Pt called and is requesting to be under the care of Dr. Alain Marion. Pt states she feels like she has been seen more recently than shown. Please advise.   F/u  Last office visit  04/04/2015.

## 2019-05-07 NOTE — Telephone Encounter (Signed)
Ok. Thanks!

## 2019-05-08 NOTE — Telephone Encounter (Signed)
Pt states exposed to covid 2 weeks ago. Neg test.  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 Pt hopes she can come in office. Pt scheduled 05/12/19 at 3:20pm with Dr Alain Marion

## 2019-05-08 NOTE — Telephone Encounter (Signed)
Please call pt to schedule est care visit

## 2019-05-08 NOTE — Telephone Encounter (Signed)
Will triage pt the day before

## 2019-05-08 NOTE — Telephone Encounter (Signed)
  Scheduler called patient to schedule appointment to re-establish care Left message voicemail

## 2019-05-12 ENCOUNTER — Ambulatory Visit (INDEPENDENT_AMBULATORY_CARE_PROVIDER_SITE_OTHER): Payer: 59 | Admitting: Internal Medicine

## 2019-05-12 ENCOUNTER — Other Ambulatory Visit: Payer: Self-pay

## 2019-05-12 ENCOUNTER — Encounter: Payer: Self-pay | Admitting: Internal Medicine

## 2019-05-12 VITALS — BP 118/82 | HR 86 | Temp 98.0°F | Ht 66.5 in | Wt 124.0 lb

## 2019-05-12 DIAGNOSIS — Z0000 Encounter for general adult medical examination without abnormal findings: Secondary | ICD-10-CM | POA: Diagnosis not present

## 2019-05-12 DIAGNOSIS — Z20822 Contact with and (suspected) exposure to covid-19: Secondary | ICD-10-CM

## 2019-05-12 DIAGNOSIS — Z23 Encounter for immunization: Secondary | ICD-10-CM

## 2019-05-12 NOTE — Progress Notes (Signed)
Subjective:  Patient ID: Linda Sims, female    DOB: 04-21-1981  Age: 39 y.o. MRN: [id number]  CC: No chief complaint on file.   HPI Linda Sims presents for a well exam.  She has a COVID 19 vaccine question -she was exposed to COVID-19 in Oct 2020 and Jan 2020.  She was not ill with symptoms  Outpatient Medications Prior to Visit  Medication Sig Dispense Refill  . Cholecalciferol (VITAMIN D3) 2000 UNITS capsule Take 1 capsule (2,000 Units total) by mouth daily. 100 capsule 3  . Omega-3 Fatty Acids (FISH OIL PO) Take 1 capsule by mouth daily.    . Prenatal Vit-Fe Fumarate-FA (PRENATAL PO) Take 1 tablet by mouth daily.      No facility-administered medications prior to visit.    ROS: Review of Systems  Constitutional: Negative for activity change, appetite change, chills, fatigue and unexpected weight change.  HENT: Negative for congestion, mouth sores and sinus pressure.   Eyes: Negative for visual disturbance.  Respiratory: Negative for cough and chest tightness.   Gastrointestinal: Negative for abdominal pain and nausea.  Genitourinary: Negative for difficulty urinating, frequency and vaginal pain.  Musculoskeletal: Negative for back pain and gait problem.  Skin: Negative for pallor and rash.  Neurological: Negative for dizziness, tremors, weakness, numbness and headaches.  Psychiatric/Behavioral: Negative for confusion and sleep disturbance.    Objective:  BP 118/82 (BP Location: Left Arm, Patient Position: Sitting, Cuff Size: Normal)   Pulse 86   Temp 98 F (36.7 C)   Ht 5' 6.5" (1.689 m)   Wt 124 lb (56.2 kg)   SpO2 98%   BMI 19.71 kg/m   BP Readings from Last 3 Encounters:  05/12/19 118/82  04/04/15 114/72  05/01/14 (!) 99/57    Wt Readings from Last 3 Encounters:  05/12/19 124 lb (56.2 kg)  04/04/15 122 lb (55.3 kg)  04/29/14 144 lb 3.2 oz (65.4 kg)    Physical Exam Constitutional:      General: She is not in acute distress.    Appearance: She is  well-developed.  HENT:     Head: Normocephalic.     Right Ear: External ear normal.     Left Ear: External ear normal.     Nose: Nose normal.  Eyes:     General:        Right eye: No discharge.        Left eye: No discharge.     Conjunctiva/sclera: Conjunctivae normal.     Pupils: Pupils are equal, round, and reactive to light.  Neck:     Thyroid: No thyromegaly.     Vascular: No JVD.     Trachea: No tracheal deviation.  Cardiovascular:     Rate and Rhythm: Normal rate and regular rhythm.     Heart sounds: Normal heart sounds.  Pulmonary:     Effort: No respiratory distress.     Breath sounds: No stridor. No wheezing.  Abdominal:     General: Bowel sounds are normal. There is no distension.     Palpations: Abdomen is soft. There is no mass.     Tenderness: There is no abdominal tenderness. There is no guarding or rebound.  Musculoskeletal:        General: No tenderness.     Cervical back: Normal range of motion and neck supple.  Lymphadenopathy:     Cervical: No cervical adenopathy.  Skin:    Findings: No erythema or rash.  Neurological:     Cranial  Nerves: No cranial nerve deficit.     Motor: No abnormal muscle tone.     Coordination: Coordination normal.     Deep Tendon Reflexes: Reflexes normal.  Psychiatric:        Behavior: Behavior normal.        Thought Content: Thought content normal.        Judgment: Judgment normal.     Lab Results  Component Value Date   WBC 6.8 04/04/2015   HGB 13.8 04/04/2015   HCT 40.6 04/04/2015   PLT 220.0 04/04/2015   GLUCOSE 98 04/04/2015   ALT 14 04/04/2015   AST 14 04/04/2015   NA 142 04/04/2015   K 5.0 04/04/2015   CL 106 04/04/2015   CREATININE 0.98 04/04/2015   BUN 11 04/04/2015   CO2 31 04/04/2015   TSH 0.83 04/04/2015    No results found.  Assessment & Plan:   There are no diagnoses linked to this encounter.   No orders of the defined types were placed in this encounter.    Follow-up: No follow-ups on  file.  Walker Kehr, MD

## 2019-05-13 ENCOUNTER — Other Ambulatory Visit: Payer: Self-pay | Admitting: Internal Medicine

## 2019-05-13 LAB — URINALYSIS, ROUTINE W REFLEX MICROSCOPIC
Bilirubin Urine: NEGATIVE
Hgb urine dipstick: NEGATIVE
Ketones, ur: NEGATIVE
Nitrite: NEGATIVE
RBC / HPF: NONE SEEN (ref 0–?)
Specific Gravity, Urine: 1.02 (ref 1.000–1.030)
Total Protein, Urine: NEGATIVE
Urine Glucose: NEGATIVE
Urobilinogen, UA: 0.2 (ref 0.0–1.0)
pH: 7 (ref 5.0–8.0)

## 2019-05-13 LAB — CBC WITH DIFFERENTIAL/PLATELET
Basophils Absolute: 0.1 10*3/uL (ref 0.0–0.1)
Basophils Relative: 0.8 % (ref 0.0–3.0)
Eosinophils Absolute: 0 10*3/uL (ref 0.0–0.7)
Eosinophils Relative: 0.7 % (ref 0.0–5.0)
HCT: 39.8 % (ref 36.0–46.0)
Hemoglobin: 13.8 g/dL (ref 12.0–15.0)
Lymphocytes Relative: 37.6 % (ref 12.0–46.0)
Lymphs Abs: 2.5 10*3/uL (ref 0.7–4.0)
MCHC: 34.6 g/dL (ref 30.0–36.0)
MCV: 90.2 fl (ref 78.0–100.0)
Monocytes Absolute: 0.6 10*3/uL (ref 0.1–1.0)
Monocytes Relative: 8.8 % (ref 3.0–12.0)
Neutro Abs: 3.4 10*3/uL (ref 1.4–7.7)
Neutrophils Relative %: 52.1 % (ref 43.0–77.0)
Platelets: 203 10*3/uL (ref 150.0–400.0)
RBC: 4.41 Mil/uL (ref 3.87–5.11)
RDW: 11.7 % (ref 11.5–15.5)
WBC: 6.6 10*3/uL (ref 4.0–10.5)

## 2019-05-13 LAB — BASIC METABOLIC PANEL
BUN: 13 mg/dL (ref 6–23)
CO2: 30 mEq/L (ref 19–32)
Calcium: 9.5 mg/dL (ref 8.4–10.5)
Chloride: 103 mEq/L (ref 96–112)
Creatinine, Ser: 0.79 mg/dL (ref 0.40–1.20)
GFR: 82.19 mL/min (ref >60.00)
Glucose, Bld: 90 mg/dL (ref 70–99)
Potassium: 3.9 mEq/L (ref 3.5–5.1)
Sodium: 138 mEq/L (ref 135–145)

## 2019-05-13 LAB — LIPID PANEL
Cholesterol: 190 mg/dL (ref 0–200)
HDL: 90.3 mg/dL (ref >39.00)
LDL Cholesterol: 92 mg/dL (ref 0–99)
NonHDL: 100.18
Total CHOL/HDL Ratio: 2
Triglycerides: 42 mg/dL (ref 0.0–149.0)
VLDL: 8.4 mg/dL (ref 0.0–40.0)

## 2019-05-13 LAB — HEPATIC FUNCTION PANEL
ALT: 16 U/L (ref 0–35)
AST: 17 U/L (ref 0–37)
Albumin: 4.7 g/dL (ref 3.5–5.2)
Alkaline Phosphatase: 26 U/L — ABNORMAL LOW (ref 39–117)
Bilirubin, Direct: 0.1 mg/dL (ref 0.0–0.3)
Total Bilirubin: 0.4 mg/dL (ref 0.2–1.2)
Total Protein: 7.3 g/dL (ref 6.0–8.3)

## 2019-05-13 LAB — TSH: TSH: 1.1 u[IU]/mL (ref 0.35–4.50)

## 2019-05-13 LAB — SAR COV2 SEROLOGY (COVID19)AB(IGG),IA: SARS CoV2 AB IGG: NEGATIVE

## 2019-05-13 MED ORDER — CEPHALEXIN 500 MG PO CAPS: 500.0000 mg | ORAL_CAPSULE | Freq: Three times a day (TID) | ORAL | 0 refills | Status: DC

## 2019-05-18 NOTE — Assessment & Plan Note (Addendum)
We discussed age appropriate health related issues, including available/recomended screening tests and vaccinations. We discussed a need for adhering to healthy diet and exercise. Labs were ordered to be later reviewed . All questions were answered. Folic acid daily.  Covid vaccination questions addressed Covid 19 IgG were obtained due to history of exposure (at the patient's request)

## 2019-05-19 ENCOUNTER — Ambulatory Visit (INDEPENDENT_AMBULATORY_CARE_PROVIDER_SITE_OTHER): Payer: 59 | Admitting: Internal Medicine

## 2019-05-19 ENCOUNTER — Encounter: Payer: Self-pay | Admitting: Internal Medicine

## 2019-05-19 DIAGNOSIS — B029 Zoster without complications: Secondary | ICD-10-CM

## 2019-05-19 MED ORDER — IBUPROFEN 600 MG PO TABS: ORAL_TABLET | ORAL | 3 refills | Status: DC

## 2019-05-19 MED ORDER — VALACYCLOVIR HCL 1 G PO TABS: 1000.0000 mg | ORAL_TABLET | Freq: Three times a day (TID) | ORAL | 0 refills | Status: DC

## 2019-05-19 MED ORDER — TRIAMCINOLONE ACETONIDE 0.5 % EX CREA: 1.0000 "application " | TOPICAL_CREAM | Freq: Four times a day (QID) | CUTANEOUS | 1 refills | Status: AC

## 2019-05-19 NOTE — Progress Notes (Signed)
Virtual Visit via Video Note  I connected with Linda Sims on 05/19/19 at  3:40 PM EST by a video enabled telemedicine application and verified that I am speaking with the correct person using two identifiers.   I discussed the limitations of evaluation and management by telemedicine and the availability of in person appointments. The patient expressed understanding and agreed to proceed.  History of Present Illness: C/o burning rash on the R flank x 1 wk - burning  There has been no runny nose, cough, chest pain, shortness of breath, abdominal pain, diarrhea, constipation, arthralgias.   Observations/Objective: The patient appears to be in no acute distress, looks well. Typical H zoster rash crop on the R anterior flank  Assessment and Plan:  See my Assessment and Plan. Follow Up Instructions:    I discussed the assessment and treatment plan with the patient. The patient was provided an opportunity to ask questions and all were answered. The patient agreed with the plan and demonstrated an understanding of the instructions.   The patient was advised to call back or seek an in-person evaluation if the symptoms worsen or if the condition fails to improve as anticipated.  I provided face-to-face time during this encounter. We were at different locations.   Walker Kehr, MD

## 2019-05-19 NOTE — Assessment & Plan Note (Signed)
R flank Valtrex Ibuprofen Triamc cream

## 2019-06-12 ENCOUNTER — Ambulatory Visit: Payer: 59

## 2020-02-20 ENCOUNTER — Ambulatory Visit: Payer: 59

## 2020-05-11 DIAGNOSIS — Z03818 Encounter for observation for suspected exposure to other biological agents ruled out: Secondary | ICD-10-CM | POA: Diagnosis not present

## 2020-05-16 DIAGNOSIS — Z03818 Encounter for observation for suspected exposure to other biological agents ruled out: Secondary | ICD-10-CM | POA: Diagnosis not present

## 2020-08-03 DIAGNOSIS — Z124 Encounter for screening for malignant neoplasm of cervix: Secondary | ICD-10-CM | POA: Diagnosis not present

## 2020-08-03 DIAGNOSIS — Z01419 Encounter for gynecological examination (general) (routine) without abnormal findings: Secondary | ICD-10-CM | POA: Diagnosis not present

## 2020-09-08 ENCOUNTER — Telehealth (INDEPENDENT_AMBULATORY_CARE_PROVIDER_SITE_OTHER): Payer: Self-pay | Admitting: Internal Medicine

## 2020-09-08 ENCOUNTER — Encounter: Payer: Self-pay | Admitting: Internal Medicine

## 2020-09-08 VITALS — Wt 121.3 lb

## 2020-09-08 DIAGNOSIS — Z20822 Contact with and (suspected) exposure to covid-19: Secondary | ICD-10-CM

## 2020-09-08 NOTE — Progress Notes (Signed)
Virtual Visit via Video Note  I connected with Linda Sims on 09/08/20 at  3:30 PM EDT by a video enabled telemedicine application and verified that I am speaking with the correct person using two identifiers.  Location patient: home Location provider: work office Persons participating in the virtual visit: patient, provider  I discussed the limitations of evaluation and management by telemedicine and the availability of in person appointments. The patient expressed understanding and agreed to proceed.   HPI: Yesterday she woke up with a sore throat, headache, fatigue, myalgias and some nausea.  She is a Education officer, museum.  She has had 3 COVID vaccines.  She took a home COVID test this morning that was negative.  She has not been coughing, she has also had some ear pain.  Several colleagues at work have had same symptoms.   ROS: Constitutional: Denies fever, chills, diaphoresis.  HEENT: Denies photophobia, eye pain, redness, hearing loss, mouth sores, trouble swallowing, neck pain, neck stiffness and tinnitus.   Respiratory: Denies SOB, DOE, cough, chest tightness,  and wheezing.   Cardiovascular: Denies chest pain, palpitations and leg swelling.  Gastrointestinal: Denies nausea, vomiting, abdominal pain, diarrhea, constipation, blood in stool and abdominal distention.  Genitourinary: Denies dysuria, urgency, frequency, hematuria, flank pain and difficulty urinating.  Endocrine: Denies: hot or cold intolerance, sweats, changes in hair or nails, polyuria, polydipsia. Musculoskeletal: Denies back pain, joint swelling, arthralgias and gait problem.  Skin: Denies pallor, rash and wound.  Neurological: Denies dizziness, seizures, syncope, weakness, light-headedness, numbness. Hematological: Denies adenopathy. Easy bruising, personal or family bleeding history  Psychiatric/Behavioral: Denies suicidal ideation, mood changes, confusion, nervousness, sleep disturbance and agitation   Past  Medical History:  Diagnosis Date  . Medical history non-contributory     Past Surgical History:  Procedure Laterality Date  . WISDOM TOOTH EXTRACTION      Family History  Problem Relation Age of Onset  . Alcohol abuse Neg Hx   . Arthritis Neg Hx   . Asthma Neg Hx   . Birth defects Neg Hx   . Cancer Neg Hx   . COPD Neg Hx   . Depression Neg Hx   . Diabetes Neg Hx   . Drug abuse Neg Hx   . Early death Neg Hx   . Hearing loss Neg Hx   . Heart disease Neg Hx   . Hyperlipidemia Neg Hx   . Hypertension Neg Hx   . Kidney disease Neg Hx   . Learning disabilities Neg Hx   . Mental illness Neg Hx   . Mental retardation Neg Hx   . Miscarriages / Stillbirths Neg Hx   . Stroke Neg Hx   . Vision loss Neg Hx   . Varicose Veins Neg Hx     SOCIAL HX:   reports that she has never smoked. She has never used smokeless tobacco. She reports that she does not drink alcohol and does not use drugs.   Current Outpatient Medications:  .  Cholecalciferol (VITAMIN D3) 2000 UNITS capsule, Take 1 capsule (2,000 Units total) by mouth daily., Disp: 100 capsule, Rfl: 3 .  ibuprofen (ADVIL) 600 MG tablet, Take tid pc prn pain, Disp: 60 tablet, Rfl: 3  EXAM:   VITALS per patient if applicable:   GENERAL: alert, oriented, appears well and in no acute distress  HEENT: atraumatic, conjunttiva clear, no obvious abnormalities on inspection of external nose and ears  NECK: normal movements of the head and neck  LUNGS: on inspection  no signs of respiratory distress, breathing rate appears normal, no obvious gross increased work of breathing, gasping or wheezing  CV: no obvious cyanosis  MS: moves all visible extremities without noticeable abnormality  PSYCH/NEURO: pleasant and cooperative, no obvious depression or anxiety, speech and thought processing grossly intact  ASSESSMENT AND PLAN:   Suspected COVID-19 virus infection  -I have asked her to get a COVID PCR test done, she agrees. -Have  advised symptomatic management for now with Tylenol or ibuprofen as needed.  May also use over-the-counter cough suppressants. -If COVID test is positive she will notify us for potential COVID directed treatment. -If COVID test is negative and she continues to have symptoms, we can see her in the office for further plan of care.   I discussed the assessment and treatment plan with the patient. The patient was provided an opportunity to ask questions and all were answered. The patient agreed with the plan and demonstrated an understanding of the instructions.   The patient was advised to call back or seek an in-person evaluation if the symptoms worsen or if the condition fails to improve as anticipated.    Lelon Frohlich, MD  [facility]

## 2020-09-09 ENCOUNTER — Encounter: Payer: Self-pay | Admitting: Internal Medicine

## 2020-09-09 DIAGNOSIS — Z03818 Encounter for observation for suspected exposure to other biological agents ruled out: Secondary | ICD-10-CM | POA: Diagnosis not present

## 2020-11-17 ENCOUNTER — Ambulatory Visit (INDEPENDENT_AMBULATORY_CARE_PROVIDER_SITE_OTHER): Payer: BC Managed Care – PPO | Admitting: Internal Medicine

## 2020-11-17 ENCOUNTER — Other Ambulatory Visit: Payer: Self-pay

## 2020-11-17 ENCOUNTER — Encounter: Payer: Self-pay | Admitting: Internal Medicine

## 2020-11-17 VITALS — BP 101/72 | HR 76 | Temp 97.9°F | Ht 66.5 in | Wt 122.4 lb

## 2020-11-17 DIAGNOSIS — D485 Neoplasm of uncertain behavior of skin: Secondary | ICD-10-CM

## 2020-11-17 DIAGNOSIS — Z0000 Encounter for general adult medical examination without abnormal findings: Secondary | ICD-10-CM

## 2020-11-17 DIAGNOSIS — M2550 Pain in unspecified joint: Secondary | ICD-10-CM | POA: Insufficient documentation

## 2020-11-17 DIAGNOSIS — Z0001 Encounter for general adult medical examination with abnormal findings: Secondary | ICD-10-CM

## 2020-11-17 LAB — COMPREHENSIVE METABOLIC PANEL
ALT: 17 U/L (ref 0–35)
AST: 15 U/L (ref 0–37)
Albumin: 4.3 g/dL (ref 3.5–5.2)
Alkaline Phosphatase: 25 U/L — ABNORMAL LOW (ref 39–117)
BUN: 10 mg/dL (ref 6–23)
CO2: 30 mEq/L (ref 19–32)
Calcium: 9.1 mg/dL (ref 8.4–10.5)
Chloride: 102 mEq/L (ref 96–112)
Creatinine, Ser: 0.75 mg/dL (ref 0.40–1.20)
GFR: 101.39 mL/min (ref >60.00)
Glucose, Bld: 84 mg/dL (ref 70–99)
Potassium: 3.9 mEq/L (ref 3.5–5.1)
Sodium: 138 mEq/L (ref 135–145)
Total Bilirubin: 0.9 mg/dL (ref 0.2–1.2)
Total Protein: 6.8 g/dL (ref 6.0–8.3)

## 2020-11-17 LAB — LIPID PANEL
Cholesterol: 171 mg/dL (ref 0–200)
HDL: 75.1 mg/dL (ref >39.00)
LDL Cholesterol: 87 mg/dL (ref 0–99)
NonHDL: 95.89
Total CHOL/HDL Ratio: 2
Triglycerides: 46 mg/dL (ref 0.0–149.0)
VLDL: 9.2 mg/dL (ref 0.0–40.0)

## 2020-11-17 LAB — CBC WITH DIFFERENTIAL/PLATELET
Basophils Absolute: 0 10*3/uL (ref 0.0–0.1)
Basophils Relative: 0.3 % (ref 0.0–3.0)
Eosinophils Absolute: 0 10*3/uL (ref 0.0–0.7)
Eosinophils Relative: 0.6 % (ref 0.0–5.0)
HCT: 39 % (ref 36.0–46.0)
Hemoglobin: 13.6 g/dL (ref 12.0–15.0)
Lymphocytes Relative: 31.3 % (ref 12.0–46.0)
Lymphs Abs: 1.7 10*3/uL (ref 0.7–4.0)
MCHC: 34.8 g/dL (ref 30.0–36.0)
MCV: 90.2 fl (ref 78.0–100.0)
Monocytes Absolute: 0.5 10*3/uL (ref 0.1–1.0)
Monocytes Relative: 8.5 % (ref 3.0–12.0)
Neutro Abs: 3.1 10*3/uL (ref 1.4–7.7)
Neutrophils Relative %: 59.3 % (ref 43.0–77.0)
Platelets: 170 10*3/uL (ref 150.0–400.0)
RBC: 4.32 Mil/uL (ref 3.87–5.11)
RDW: 12.2 % (ref 11.5–15.5)
WBC: 5.3 10*3/uL (ref 4.0–10.5)

## 2020-11-17 LAB — URINALYSIS, ROUTINE W REFLEX MICROSCOPIC
Bilirubin Urine: NEGATIVE
Ketones, ur: NEGATIVE
Nitrite: NEGATIVE
Specific Gravity, Urine: <=1.005 — AB (ref 1.000–1.030)
Total Protein, Urine: NEGATIVE
Urine Glucose: NEGATIVE
Urobilinogen, UA: 0.2 (ref 0.0–1.0)
pH: 6 (ref 5.0–8.0)

## 2020-11-17 LAB — TSH: TSH: 1.03 u[IU]/mL (ref 0.35–5.50)

## 2020-11-17 NOTE — Assessment & Plan Note (Signed)
Vit D  yoga

## 2020-11-17 NOTE — Progress Notes (Signed)
Subjective:  Patient ID: Linda Sims, female    DOB: [birth year]  Age: 40 y.o. MRN: [id number]  CC: Annual Exam   HPI Zyeria Hayre presents for a well exam C/o skin lesions C/o joints cracking   Outpatient Medications Prior to Visit  Medication Sig Dispense Refill   Cholecalciferol (VITAMIN D3) 2000 UNITS capsule Take 1 capsule (2,000 Units total) by mouth daily. 100 capsule 3   COLLAGEN PO Take 1 capsule by mouth daily at 6 PM.     Multiple Vitamins-Minerals (WOMENS MULTIVITAMIN PO) Take 1 tablet by mouth daily at 6 PM.     ibuprofen (ADVIL) 600 MG tablet Take tid pc prn pain (Patient not taking: Reported on 11/17/2020) 60 tablet 3   No facility-administered medications prior to visit.    ROS: Review of Systems  Constitutional:  Negative for activity change, appetite change, chills, fatigue and unexpected weight change.  HENT:  Negative for congestion, mouth sores and sinus pressure.   Eyes:  Negative for visual disturbance.  Respiratory:  Negative for cough and chest tightness.   Gastrointestinal:  Negative for abdominal pain and nausea.  Genitourinary:  Negative for difficulty urinating, frequency and vaginal pain.  Musculoskeletal:  Positive for arthralgias. Negative for back pain and gait problem.  Skin:  Negative for pallor and rash.  Neurological:  Negative for dizziness, tremors, weakness, numbness and headaches.  Psychiatric/Behavioral:  Negative for confusion, sleep disturbance and suicidal ideas.    Objective:  BP 101/72 (BP Location: Left Arm)   Pulse 76   Temp 97.9 F (36.6 C) (Oral)   Ht 5' 6.5" (1.689 m)   Wt 122 lb 6.4 oz (55.5 kg)   SpO2 98%   BMI 19.46 kg/m   BP Readings from Last 3 Encounters:  11/17/20 101/72  05/12/19 118/82  04/04/15 114/72    Wt Readings from Last 3 Encounters:  11/17/20 122 lb 6.4 oz (55.5 kg)  09/08/20 121 lb 4.1 oz (55 kg)  05/12/19 124 lb (56.2 kg)    Physical Exam Constitutional:      General: She is not in  acute distress.    Appearance: She is well-developed.  HENT:     Head: Normocephalic.     Right Ear: External ear normal.     Left Ear: External ear normal.     Nose: Nose normal.  Eyes:     General:        Right eye: No discharge.        Left eye: No discharge.     Conjunctiva/sclera: Conjunctivae normal.     Pupils: Pupils are equal, round, and reactive to light.  Neck:     Thyroid: No thyromegaly.     Vascular: No JVD.     Trachea: No tracheal deviation.  Cardiovascular:     Rate and Rhythm: Normal rate and regular rhythm.     Heart sounds: Normal heart sounds.  Pulmonary:     Effort: No respiratory distress.     Breath sounds: No stridor. No wheezing.  Abdominal:     General: Bowel sounds are normal. There is no distension.     Palpations: Abdomen is soft. There is no mass.     Tenderness: There is no abdominal tenderness. There is no guarding or rebound.  Musculoskeletal:        General: No tenderness.     Cervical back: Normal range of motion and neck supple. No rigidity.  Lymphadenopathy:     Cervical: No cervical adenopathy.  Skin:  Findings: No erythema or rash.  Neurological:     Mental Status: She is oriented to person, place, and time.     Cranial Nerves: No cranial nerve deficit.     Motor: No abnormal muscle tone.     Coordination: Coordination normal.     Deep Tendon Reflexes: Reflexes normal.  Psychiatric:        Behavior: Behavior normal.        Thought Content: Thought content normal.        Judgment: Judgment normal.   Skin lesion Vit D  Lab Results  Component Value Date   WBC 6.6 05/12/2019   HGB 13.8 05/12/2019   HCT 39.8 05/12/2019   PLT 203.0 05/12/2019   GLUCOSE 90 05/12/2019   CHOL 190 05/12/2019   TRIG 42.0 05/12/2019   HDL 90.30 05/12/2019   LDLCALC 92 05/12/2019   ALT 16 05/12/2019   AST 17 05/12/2019   NA 138 05/12/2019   K 3.9 05/12/2019   CL 103 05/12/2019   CREATININE 0.79 05/12/2019   BUN 13 05/12/2019   CO2 30  05/12/2019   TSH 1.10 05/12/2019    No results found.  Assessment & Plan:     Walker Kehr, MD

## 2020-11-17 NOTE — Addendum Note (Signed)
Addended by: Jacobo Forest on: 11/17/2020 09:28 AM   Modules accepted: Orders

## 2020-11-17 NOTE — Assessment & Plan Note (Signed)
Dern ref -- Dr Redmond Pulling

## 2020-11-20 ENCOUNTER — Other Ambulatory Visit: Payer: Self-pay | Admitting: Internal Medicine

## 2020-11-20 DIAGNOSIS — N390 Urinary tract infection, site not specified: Secondary | ICD-10-CM

## 2020-11-20 MED ORDER — CEPHALEXIN 500 MG PO CAPS: 500.0000 mg | ORAL_CAPSULE | Freq: Three times a day (TID) | ORAL | 0 refills | Status: DC

## 2020-11-21 NOTE — Addendum Note (Signed)
Addended by: Earnstine Regal on: 11/21/2020 01:59 PM   Modules accepted: Orders

## 2020-11-24 ENCOUNTER — Other Ambulatory Visit: Payer: Self-pay | Admitting: Internal Medicine

## 2020-11-24 DIAGNOSIS — N390 Urinary tract infection, site not specified: Secondary | ICD-10-CM

## 2020-11-24 NOTE — Addendum Note (Signed)
Addended by: Earnstine Regal on: 11/24/2020 02:12 PM   Modules accepted: Orders

## 2020-11-25 ENCOUNTER — Ambulatory Visit
Admission: RE | Admit: 2020-11-25 | Discharge: 2020-11-25 | Disposition: A | Discharge status: Home / Self Care | Payer: BC Managed Care – PPO | Source: Ambulatory Visit | Attending: Internal Medicine | Admitting: Internal Medicine

## 2020-11-25 ENCOUNTER — Other Ambulatory Visit: Payer: Self-pay

## 2020-11-25 DIAGNOSIS — N390 Urinary tract infection, site not specified: Secondary | ICD-10-CM

## 2020-12-27 ENCOUNTER — Encounter: Payer: Self-pay | Admitting: Internal Medicine

## 2020-12-28 DIAGNOSIS — L918 Other hypertrophic disorders of the skin: Secondary | ICD-10-CM | POA: Diagnosis not present

## 2021-08-24 ENCOUNTER — Ambulatory Visit (INDEPENDENT_AMBULATORY_CARE_PROVIDER_SITE_OTHER): Payer: Managed Care, Other (non HMO) | Admitting: Internal Medicine

## 2021-08-24 ENCOUNTER — Encounter: Payer: Self-pay | Admitting: Internal Medicine

## 2021-08-24 VITALS — BP 112/80 | HR 76 | Temp 98.8°F | Ht 66.5 in | Wt 124.6 lb

## 2021-08-24 DIAGNOSIS — R1013 Epigastric pain: Secondary | ICD-10-CM | POA: Diagnosis not present

## 2021-08-24 DIAGNOSIS — Z0000 Encounter for general adult medical examination without abnormal findings: Secondary | ICD-10-CM

## 2021-08-24 LAB — COMPREHENSIVE METABOLIC PANEL
ALT: 20 U/L (ref 0–35)
AST: 17 U/L (ref 0–37)
Albumin: 4.8 g/dL (ref 3.5–5.2)
Alkaline Phosphatase: 29 U/L — ABNORMAL LOW (ref 39–117)
BUN: 15 mg/dL (ref 6–23)
CO2: 29 mEq/L (ref 19–32)
Calcium: 9.5 mg/dL (ref 8.4–10.5)
Chloride: 102 mEq/L (ref 96–112)
Creatinine, Ser: 0.8 mg/dL (ref 0.40–1.20)
GFR: 93.33 mL/min (ref >60.00)
Glucose, Bld: 86 mg/dL (ref 70–99)
Potassium: 4 mEq/L (ref 3.5–5.1)
Sodium: 138 mEq/L (ref 135–145)
Total Bilirubin: 0.6 mg/dL (ref 0.2–1.2)
Total Protein: 7.5 g/dL (ref 6.0–8.3)

## 2021-08-24 LAB — CBC WITH DIFFERENTIAL/PLATELET
Basophils Absolute: 0 10*3/uL (ref 0.0–0.1)
Basophils Relative: 0.5 % (ref 0.0–3.0)
Eosinophils Absolute: 0 10*3/uL (ref 0.0–0.7)
Eosinophils Relative: 0.5 % (ref 0.0–5.0)
HCT: 40.5 % (ref 36.0–46.0)
Hemoglobin: 14.1 g/dL (ref 12.0–15.0)
Lymphocytes Relative: 33.2 % (ref 12.0–46.0)
Lymphs Abs: 2.2 10*3/uL (ref 0.7–4.0)
MCHC: 34.9 g/dL (ref 30.0–36.0)
MCV: 90.7 fl (ref 78.0–100.0)
Monocytes Absolute: 0.5 10*3/uL (ref 0.1–1.0)
Monocytes Relative: 7.9 % (ref 3.0–12.0)
Neutro Abs: 3.9 10*3/uL (ref 1.4–7.7)
Neutrophils Relative %: 57.9 % (ref 43.0–77.0)
Platelets: 209 10*3/uL (ref 150.0–400.0)
RBC: 4.47 Mil/uL (ref 3.87–5.11)
RDW: 12.3 % (ref 11.5–15.5)
WBC: 6.7 10*3/uL (ref 4.0–10.5)

## 2021-08-24 LAB — URINALYSIS, ROUTINE W REFLEX MICROSCOPIC
Bilirubin Urine: NEGATIVE
Hgb urine dipstick: NEGATIVE
Ketones, ur: NEGATIVE
Nitrite: NEGATIVE
RBC / HPF: NONE SEEN (ref 0–?)
Specific Gravity, Urine: 1.01 (ref 1.000–1.030)
Total Protein, Urine: NEGATIVE
Urine Glucose: NEGATIVE
Urobilinogen, UA: 0.2 (ref 0.0–1.0)
pH: 7.5 (ref 5.0–8.0)

## 2021-08-24 LAB — TSH: TSH: 0.94 u[IU]/mL (ref 0.35–5.50)

## 2021-08-24 MED ORDER — PANTOPRAZOLE SODIUM 40 MG PO TBEC: 40.0000 mg | DELAYED_RELEASE_TABLET | Freq: Every day | ORAL | 3 refills | Status: DC

## 2021-08-24 NOTE — Progress Notes (Signed)
Subjective:  Patient ID: Linda Sims, female    DOB: [birth year]  Age: 41 y.o. MRN: [id number]  CC: Annual Exam (No concerns. )   HPI Linda Sims presents for a well exam Complaining of occasional GERD Outpatient Medications Prior to Visit  Medication Sig Dispense Refill   Cholecalciferol (VITAMIN D3) 2000 UNITS capsule Take 1 capsule (2,000 Units total) by mouth daily. 100 capsule 3   COLLAGEN PO Take 1 capsule by mouth daily at 6 PM.     Multiple Vitamins-Minerals (WOMENS MULTIVITAMIN PO) Take 1 tablet by mouth daily at 6 PM.     cephALEXin (KEFLEX) 500 MG capsule Take 1 capsule (500 mg total) by mouth 3 (three) times daily. 15 capsule 0   No facility-administered medications prior to visit.    ROS: Review of Systems  Constitutional:  Negative for activity change, appetite change, chills, fatigue and unexpected weight change.  HENT:  Negative for congestion, mouth sores and sinus pressure.   Eyes:  Negative for visual disturbance.  Respiratory:  Negative for cough and chest tightness.   Gastrointestinal:  Negative for abdominal pain and nausea.  Genitourinary:  Negative for difficulty urinating, frequency and vaginal pain.  Musculoskeletal:  Negative for back pain and gait problem.  Skin:  Negative for pallor and rash.  Neurological:  Negative for dizziness, tremors, weakness, numbness and headaches.  Psychiatric/Behavioral:  Negative for confusion and sleep disturbance.    Objective:  BP 112/80   Pulse 76   Temp 98.8 F (37.1 C) (Oral)   Ht 5' 6.5" (1.689 m)   Wt 124 lb 9.6 oz (56.5 kg)   LMP 08/17/2021 (Exact Date)   SpO2 96%   BMI 19.81 kg/m   BP Readings from Last 3 Encounters:  08/24/21 112/80  11/17/20 101/72  05/12/19 118/82    Wt Readings from Last 3 Encounters:  08/24/21 124 lb 9.6 oz (56.5 kg)  11/17/20 122 lb 6.4 oz (55.5 kg)  09/08/20 121 lb 4.1 oz (55 kg)    Physical Exam Constitutional:      General: She is not in acute distress.     Appearance: She is well-developed. She is obese.  HENT:     Head: Normocephalic.     Right Ear: External ear normal.     Left Ear: External ear normal.     Nose: Nose normal.  Eyes:     General:        Right eye: No discharge.        Left eye: No discharge.     Conjunctiva/sclera: Conjunctivae normal.     Pupils: Pupils are equal, round, and reactive to light.  Neck:     Thyroid: No thyromegaly.     Vascular: No JVD.     Trachea: No tracheal deviation.  Cardiovascular:     Rate and Rhythm: Normal rate and regular rhythm.     Heart sounds: Normal heart sounds.  Pulmonary:     Effort: No respiratory distress.     Breath sounds: No stridor. No wheezing.  Abdominal:     General: Bowel sounds are normal. There is no distension.     Palpations: Abdomen is soft. There is no mass.     Tenderness: There is no abdominal tenderness. There is no guarding or rebound.  Musculoskeletal:        General: No tenderness.     Cervical back: Normal range of motion and neck supple. No rigidity.  Lymphadenopathy:     Cervical: No cervical adenopathy.  Skin:    Findings: No erythema or rash.  Neurological:     Mental Status: She is oriented to person, place, and time.     Cranial Nerves: No cranial nerve deficit.     Motor: No abnormal muscle tone.     Coordination: Coordination normal.     Deep Tendon Reflexes: Reflexes normal.  Psychiatric:        Behavior: Behavior normal.        Thought Content: Thought content normal.        Judgment: Judgment normal.    Lab Results  Component Value Date   WBC 6.7 08/24/2021   HGB 14.1 08/24/2021   HCT 40.5 08/24/2021   PLT 209.0 08/24/2021   GLUCOSE 86 08/24/2021   CHOL 171 11/17/2020   TRIG 46.0 11/17/2020   HDL 75.10 11/17/2020   LDLCALC 87 11/17/2020   ALT 20 08/24/2021   AST 17 08/24/2021   NA 138 08/24/2021   K 4.0 08/24/2021   CL 102 08/24/2021   CREATININE 0.80 08/24/2021   BUN 15 08/24/2021   CO2 29 08/24/2021   TSH 0.94  08/24/2021    US Renal  Result Date: 11/25/2020 CLINICAL DATA:  Recurrent UTI EXAM: RENAL / URINARY TRACT ULTRASOUND COMPLETE COMPARISON:  None. FINDINGS: Right Kidney: Renal measurements: 10.9 x 4.2 x 4 cm = volume: 95.9 mL. Echogenicity within normal limits. No mass or hydronephrosis visualized. Left Kidney: Renal measurements: 10.7 x 5.8 x 4.8 cm = volume: 148.2 mL. Echogenicity within normal limits. No mass or hydronephrosis visualized. Bladder: Appears normal for degree of bladder distention. Other: None. IMPRESSION: Negative renal ultrasound Electronically Signed   By: Donavan Foil M.D.   On: 11/25/2020 20:21    Assessment & Plan:   Problem List Items Addressed This Visit     Epigastric pain    Occasional GERD symptoms.  Use Protonix as needed GI appointment if problems       Well adult exam - Primary    We discussed age appropriate health related issues, including available/recomended screening tests and vaccinations. We discussed a need for adhering to healthy diet and exercise. All questions were answered. Yearly GYN exam       Relevant Orders   TSH (Completed)   Urinalysis   CBC with Differential/Platelet (Completed)   Comprehensive metabolic panel (Completed)      Meds ordered this encounter  Medications   pantoprazole (PROTONIX) 40 MG tablet    Sig: Take 1 tablet (40 mg total) by mouth daily.    Dispense:  30 tablet    Refill:  3      Follow-up: Return in about 1 year (around 08/25/2022) for Wellness Exam.  Walker Kehr, MD

## 2021-08-24 NOTE — Assessment & Plan Note (Addendum)
Occasional GERD symptoms.  Use Protonix as needed GI appointment if problems

## 2021-09-05 ENCOUNTER — Encounter: Payer: Self-pay | Admitting: Physician Assistant

## 2021-09-18 NOTE — Assessment & Plan Note (Signed)
We discussed age appropriate health related issues, including available/recomended screening tests and vaccinations. We discussed a need for adhering to healthy diet and exercise. All questions were answered. Yearly GYN exam

## 2021-09-26 ENCOUNTER — Ambulatory Visit (INDEPENDENT_AMBULATORY_CARE_PROVIDER_SITE_OTHER): Payer: Managed Care, Other (non HMO) | Admitting: Physician Assistant

## 2021-09-26 ENCOUNTER — Encounter: Payer: Self-pay | Admitting: Physician Assistant

## 2021-09-26 VITALS — BP 96/64 | HR 68 | Ht 66.5 in | Wt 121.8 lb

## 2021-09-26 DIAGNOSIS — R1906 Epigastric swelling, mass or lump: Secondary | ICD-10-CM

## 2021-09-26 DIAGNOSIS — R1310 Dysphagia, unspecified: Secondary | ICD-10-CM | POA: Diagnosis not present

## 2021-09-26 DIAGNOSIS — K219 Gastro-esophageal reflux disease without esophagitis: Secondary | ICD-10-CM

## 2021-09-26 MED ORDER — PANTOPRAZOLE SODIUM 40 MG PO TBEC: 40.0000 mg | DELAYED_RELEASE_TABLET | Freq: Every day | ORAL | 6 refills | Status: DC

## 2021-09-26 NOTE — Progress Notes (Addendum)
Subjective:    Patient ID: Linda Sims, female    DOB: 27-Aug-1980, 41 y.o.   MRN: [id number]  HPI  [name] is a pleasant 41 year old female, new to GI today referred by Mart Piggs MD for evaluation of subxiphoid discomfort and vague dysphagia. Patient is generally in good health, she has not had any prior GI evaluation. She says that she has been having her current symptoms for about 8 or 9 months and interestingly symptoms seem to be exacerbated just prior to her menstrual period, and then improve within a few days after menses start.  She describes a discomfort in the subxiphoid area with a spasm type sensation.  Generally does not have any difficulty with liquids, solid foods cause issues with a feeling of fullness or like food is sitting in the upper stomach or subxiphoid area postprandially.  She has not had any overt episodes of food lodging or regurgitation.  She says she feels heavy and full after eating in the upper abdomen.  No nausea or vomiting, appetite has been fine, weight has been stable, no changes in bowel habits, no melena or hematochezia. No regular aspirin or NSAID use. Generally symptoms are worse at night and will awaken her from sleep at times with the substernal discomfort.  She usually feels better if she gets up and sits up. She was given a prescription for pantoprazole a couple of months ago but was told just to use this as needed and actually has only taken it once or twice with no change in symptoms. She was recently seen by GYN because of the association with her menstrual cycle, evaluation they are negative and was referred to GI.  Interestingly in between her menstrual periods she says she has very little in the way of the symptoms.  Review of Systems Pertinent positive and negative review of systems were noted in the above HPI section.  All other review of systems was otherwise negative.   Outpatient Encounter Medications as of 09/26/2021  Medication Sig    Cholecalciferol (VITAMIN D3) 2000 UNITS capsule Take 1 capsule (2,000 Units total) by mouth daily.   COLLAGEN PO Take 1 capsule by mouth daily at 6 PM.   CRANBERRY CONCENTRATE PO Take by mouth.   ELDERBERRY PO Take by mouth.   Multiple Vitamins-Minerals (WOMENS MULTIVITAMIN PO) Take 1 tablet by mouth daily at 6 PM.   [DISCONTINUED] pantoprazole (PROTONIX) 40 MG tablet Take 1 tablet (40 mg total) by mouth daily.   pantoprazole (PROTONIX) 40 MG tablet Take 1 tablet (40 mg total) by mouth daily before breakfast.   No facility-administered encounter medications on file as of 09/26/2021.   No Known Allergies Patient Active Problem List   Diagnosis Date Noted   Epigastric pain 08/24/2021   Neoplasm of uncertain behavior of skin 11/17/2020   Arthralgia 11/17/2020   Shingles 05/19/2019   Supervision of other normal pregnancy 04/30/2014   Active labor 04/29/2014   Well adult exam 11/11/2013   Social History   Socioeconomic History   Marital status: Married    Spouse name: Not on file   Number of children: Not on file   Years of education: Not on file   Highest education level: Not on file  Occupational History   Not on file  Tobacco Use   Smoking status: Never   Smokeless tobacco: Never  Vaping Use   Vaping Use: Never used  Substance and Sexual Activity   Alcohol use: No   Drug use: No  Sexual activity: Yes    Birth control/protection: Condom  Other Topics Concern   Not on file  Social History Narrative   Not on file   Social Determinants of Health   Financial Resource Strain: Not on file  Food Insecurity: Not on file  Transportation Needs: Not on file  Physical Activity: Not on file  Stress: Not on file  Social Connections: Not on file  Intimate Partner Violence: Not on file    Ms. Saggese's family history is not on file.      Objective:    Vitals:   09/26/21 0821  BP: 96/64  Pulse: 68    Physical Exam Well-developed well-nourished WF  in no acute  distress.   [id number],664  BMI 19.3  HEENT; nontraumatic normocephalic, EOMI, PE R LA, sclera anicteric. Oropharynx; not done Neck; supple, no JVD Cardiovascular; regular rate and rhythm with S1-S2, no murmur rub or gallop Pulmonary; Clear bilaterally Abdomen; soft, nontender, nondistended, no palpable mass or hepatosplenomegaly, bowel sounds are active Rectal;not done Skin; benign exam, no jaundice rash or appreciable lesions Extremities; no clubbing cyanosis or edema skin warm and dry Neuro/Psych; alert and oriented x4, grossly nonfocal mood and affect appropriate        Assessment & Plan:   #47 41 year old female with 8 to 48-monthhistory of intermittent subxiphoid discomfort, fullness and spasm in the subxiphoid area and sensation of food sitting in this area.  No chronic heartburn or indigestion, no abdominal pain, no episodes requiring regurgitation. Symptoms much more prevalent just prior to her menstrual period, and then improved within a couple of days of onset of menses.  Symptoms are consistent with GERD, mild dysphagia.  Rule out component of esophagitis, early stricture, esophageal dysmotility.  Plan; we discussed an antireflux regimen and patient was given antireflux instruction sheet.  We discussed n.p.o. for 3 hours prior to bedtime and elevating her back 45 degrees for sleep.  She will start taking Protonix 40 mg p.o. every morning AC breakfast on a daily basis, as a trial over the next couple of months. Patient will be scheduled for upper endoscopy, possible esophageal dilation with Dr. BTarri Glenn  Procedure was discussed in detail with the patient including indications risks and benefits and she is agreeable to proceed. Further recommendations pending findings at EGD  AAlfredia FergusonPA-C 09/26/2021   Cc: Plotnikov, AEvie Lacks MD

## 2021-09-26 NOTE — Progress Notes (Signed)
Reviewed and agree with management plans. ? ?Luis Sami L. Keghan Mcfarren, MD, MPH  ?

## 2021-09-26 NOTE — Patient Instructions (Signed)
If you are age 39 or younger, your body mass index should be between 19-25. Your Body mass index is 19.36 kg/m. If this is out of the aformentioned range listed, please consider follow up with your Primary Care Provider.  ________________________________________________________  The [facility] providers would like to encourage you to use [facility] to communicate with providers for non-urgent requests or questions.  Due to long hold times on the telephone, sending your provider a message by [facility] may be a faster and more efficient way to get a response.  Please allow 48 business hours for a response.  Please remember that this is for non-urgent requests.  _______________________________________________________  Linda Sims have been scheduled for an endoscopy. Please follow written instructions given to you at your visit today. If you use inhalers (even only as needed), please bring them with you on the day of your procedure.  Continue Pantoprazole 40 mg 1 tablet prior to breakfast  Follow GERD hand out.  Follow up pending.  Thank you for entrusting me with your care and choosing [facility].  Amy Esterwood, PA-C

## 2021-11-01 ENCOUNTER — Ambulatory Visit: Payer: Managed Care, Other (non HMO) | Admitting: Gastroenterology

## 2021-11-01 ENCOUNTER — Telehealth: Payer: Self-pay

## 2021-11-01 ENCOUNTER — Encounter: Payer: Self-pay | Admitting: Gastroenterology

## 2021-11-01 VITALS — BP 113/75 | HR 75 | Temp 98.6°F | Resp 14 | Ht 66.0 in | Wt 121.0 lb

## 2021-11-01 DIAGNOSIS — K219 Gastro-esophageal reflux disease without esophagitis: Secondary | ICD-10-CM

## 2021-11-01 DIAGNOSIS — B9681 Helicobacter pylori [H. pylori] as the cause of diseases classified elsewhere: Secondary | ICD-10-CM

## 2021-11-01 DIAGNOSIS — R1310 Dysphagia, unspecified: Secondary | ICD-10-CM

## 2021-11-01 DIAGNOSIS — K2950 Unspecified chronic gastritis without bleeding: Secondary | ICD-10-CM

## 2021-11-01 DIAGNOSIS — R1906 Epigastric swelling, mass or lump: Secondary | ICD-10-CM

## 2021-11-01 DIAGNOSIS — K2960 Other gastritis without bleeding: Secondary | ICD-10-CM

## 2021-11-01 DIAGNOSIS — R1013 Epigastric pain: Secondary | ICD-10-CM

## 2021-11-01 MED ORDER — SODIUM CHLORIDE 0.9 % IV SOLN: 500.0000 mL | Freq: Once | INTRAVENOUS | Status: DC

## 2021-11-01 NOTE — Progress Notes (Signed)
No problems noted in the recovery room. maw 

## 2021-11-01 NOTE — Progress Notes (Signed)
Called to room to assist during endoscopic procedure.  Patient ID and intended procedure confirmed with present staff. Received instructions for my participation in the procedure from the performing physician.  

## 2021-11-01 NOTE — Patient Instructions (Addendum)
You may resume your current medications today. Await biopsy results.  May take 1-3 weeks to receive pathology results. Dr. Payton Emerald nurse will call to schedule a follow up appointment. Please call if any questions or concerns.    YOU HAD AN ENDOSCOPIC PROCEDURE TODAY AT [facility]:   Refer to the procedure report that was given to you for any specific questions about what was found during the examination.  If the procedure report does not answer your questions, please call your gastroenterologist to clarify.  If you requested that your care partner not be given the details of your procedure findings, then the procedure report has been included in a sealed envelope for you to review at your convenience later.  YOU SHOULD EXPECT: Some feelings of bloating in the abdomen. Passage of more gas than usual.  Walking can help get rid of the air that was put into your GI tract during the procedure and reduce the bloating. If you had a lower endoscopy (such as a colonoscopy or flexible sigmoidoscopy) you may notice spotting of blood in your stool or on the toilet paper. If you underwent a bowel prep for your procedure, you may not have a normal bowel movement for a few days.  Please Note:  You might notice some irritation and congestion in your nose or some drainage.  This is from the oxygen used during your procedure.  There is no need for concern and it should clear up in a day or so.  SYMPTOMS TO REPORT IMMEDIATELY:   Following upper endoscopy (EGD)  Vomiting of blood or coffee ground material  New chest pain or pain under the shoulder blades  Painful or persistently difficult swallowing  New shortness of breath  Fever of 100F or higher  Black, tarry-looking stools  For urgent or emergent issues, a gastroenterologist can be reached at any hour by calling [phone]. Do not use MyChart messaging for urgent concerns.    DIET:  We do recommend a small meal at first, but  then you may proceed to your regular diet.  Drink plenty of fluids but you should avoid alcoholic beverages for 24 hours.  ACTIVITY:  You should plan to take it easy for the rest of today and you should NOT DRIVE or use heavy machinery until tomorrow (because of the sedation medicines used during the test).    FOLLOW UP: Our staff will call the number listed on your records the next business day following your procedure.  We will call around 7:15- 8:00 am to check on you and address any questions or concerns that you may have regarding the information given to you following your procedure. If we do not reach you, we will leave a message.  If you develop any symptoms (ie: fever, flu-like symptoms, shortness of breath, cough etc.) before then, please call [phone].  If you test positive for Covid 19 in the 2 weeks post procedure, please call and report this information to [place].    If any biopsies were taken you will be contacted by phone or by letter within the next 1-3 weeks.  Please call us at [phone] if you have not heard about the biopsies in 3 weeks.    SIGNATURES/CONFIDENTIALITY: You and/or your care partner have signed paperwork which will be entered into your electronic medical record.  These signatures attest to the fact that that the information above on your After Visit Summary has been reviewed and is understood.  Full responsibility of  the confidentiality of this discharge information lies with you and/or your care-partner.

## 2021-11-01 NOTE — Progress Notes (Signed)
Sedate, gd SR, tolerated procedure well, VSS, report to RN 

## 2021-11-01 NOTE — Telephone Encounter (Signed)
Follow up scheduled for 11/28/21 at 9:30 am with Amy, PA. Pt notified via mychart.

## 2021-11-01 NOTE — Op Note (Signed)
[facility] Patient Name: Linda Sims Procedure Date: 11/01/2021 10:22 AM MRN: [id number] Endoscopist: Thornton Park MD, MD Age: 41 Referring MD:  Date of Birth: 1980-07-16 Gender: Female Account #: [id number] Procedure:                Upper GI endoscopy Indications:              Epigastric abdominal pain, Dysphagia Medicines:                Monitored Anesthesia Care Procedure:                Pre-Anesthesia Assessment:                           - Prior to the procedure, a History and Physical                            was performed, and patient medications and                            allergies were reviewed. The patient's tolerance of                            previous anesthesia was also reviewed. The risks                            and benefits of the procedure and the sedation                            options and risks were discussed with the patient.                            All questions were answered, and informed consent                            was obtained. Prior Anticoagulants: The patient has                            taken no previous anticoagulant or antiplatelet                            agents. ASA Grade Assessment: I - A normal, healthy                            patient. After reviewing the risks and benefits,                            the patient was deemed in satisfactory condition to                            undergo the procedure.                           After obtaining informed consent, the endoscope was  passed under direct vision. Throughout the                            procedure, the patient's blood pressure, pulse, and                            oxygen saturations were monitored continuously. The                            GIF [id number] #[id number] was introduced through the                            mouth, and advanced to the third part of duodenum.                            The upper GI endoscopy was  accomplished without                            difficulty. The patient tolerated the procedure                            well. Scope In: Scope Out: Findings:                 The examined esophagus was normal. There was no                            ring, web, stricture, or esophagitis. The z-line is                            located 36 cm from the incisors. Biopsies were                            taken from the proximal/mid and distal esophagus                            with a cold forceps for histology. Estimated blood                            loss was minimal.                           The entire examined stomach was normal. Biopsies                            were taken from the antrum, body, and fundus with a                            cold forceps for histology. Estimated blood loss                            was minimal.                           The examined duodenum was  normal. Biopsies were                            taken with a cold forceps for histology. Estimated                            blood loss was minimal.                           The cardia and gastric fundus were normal on                            retroflexion.                           The exam was otherwise without abnormality. Complications:            No immediate complications. Estimated Blood Loss:     Estimated blood loss was minimal. Impression:               - Normal esophagus. Biopsied.                           - Normal stomach. Biopsied.                           - Normal examined duodenum. Biopsied.                           - The examination was otherwise normal. Recommendation:           - Patient has a contact number available for                            emergencies. The signs and symptoms of potential                            delayed complications were discussed with the                            patient. Return to normal activities tomorrow.                            Written  discharge instructions were provided to the                            patient.                           - Resume previous diet.                           - Continue present medications.                           - Await pathology results. Thornton Park MD, MD 11/01/2021 10:54:14 AM This report has been signed electronically.

## 2021-11-01 NOTE — Progress Notes (Signed)
Referring Provider: Cassandria Anger, MD Primary Care Physician:  Cassandria Anger, MD  Indication for Procedure:  Epigastric fullness, dysphagia   IMPRESSION:  Epigastric fullness, dysphagia Appropriate candidate for monitored anesthesia care  PLAN: EGD in the [facility] today   HPI: Linda Sims is a 41 y.o. female presents for endoscopic evaluation of an 8 to 77-monthhistory of intermittent subxiphoid discomfort, fullness and spasm in the subxiphoid area and sensation of food sitting in this area.  No chronic heartburn or indigestion, no abdominal pain, no episodes requiring regurgitation.  Symptoms much more prevalent just prior to her menstrual period, and then improved within a couple of days of onset of menses.    Past Medical History:  Diagnosis Date   GERD (gastroesophageal reflux disease)    Medical history non-contributory     Past Surgical History:  Procedure Laterality Date   WISDOM TOOTH EXTRACTION      Current Outpatient Medications  Medication Sig Dispense Refill   Cholecalciferol (VITAMIN D3) 2000 UNITS capsule Take 1 capsule (2,000 Units total) by mouth daily. 100 capsule 3   COLLAGEN PO Take 1 capsule by mouth daily at 6 PM.     CRANBERRY CONCENTRATE PO Take by mouth.     ELDERBERRY PO Take by mouth.     Multiple Vitamins-Minerals (WOMENS MULTIVITAMIN PO) Take 1 tablet by mouth daily at 6 PM.     pantoprazole (PROTONIX) 40 MG tablet Take 1 tablet (40 mg total) by mouth daily before breakfast. 30 tablet 6   Current Facility-Administered Medications  Medication Dose Route Frequency Provider Last Rate Last Admin   0.9 %  sodium chloride infusion  500 mL Intravenous Once BThornton Park MD        Allergies as of 11/01/2021   (No Known Allergies)    Family History  Problem Relation Age of Onset   Alcohol abuse Neg Hx    Arthritis Neg Hx    Asthma Neg Hx    Birth defects Neg Hx    Cancer Neg Hx    COPD Neg Hx    Depression Neg Hx     Diabetes Neg Hx    Drug abuse Neg Hx    Early death Neg Hx    Hearing loss Neg Hx    Heart disease Neg Hx    Hyperlipidemia Neg Hx    Hypertension Neg Hx    Kidney disease Neg Hx    Learning disabilities Neg Hx    Mental illness Neg Hx    Mental retardation Neg Hx    Miscarriages / Stillbirths Neg Hx    Stroke Neg Hx    Vision loss Neg Hx    Varicose Veins Neg Hx    Colon cancer Neg Hx    Esophageal cancer Neg Hx    Rectal cancer Neg Hx    Stomach cancer Neg Hx      Physical Exam: General:   Alert,  well-nourished, pleasant and cooperative in NAD Head:  Normocephalic and atraumatic. Eyes:  Sclera clear, no icterus.   Conjunctiva pink. Mouth:  No deformity or lesions.   Neck:  Supple; no masses or thyromegaly. Lungs:  Clear throughout to auscultation.   No wheezes. Heart:  Regular rate and rhythm; no murmurs. Abdomen:  Soft, non-tender, nondistended, normal bowel sounds, no rebound or guarding.  Msk:  Symmetrical. No boney deformities LAD: No inguinal or umbilical LAD Extremities:  No clubbing or edema. Neurologic:  Alert and  oriented x4;  grossly nonfocal  Skin:  No obvious rash or bruise. Psych:  Alert and cooperative. Normal mood and affect.     Studies/Results: No results found.    Robel Wuertz L. Tarri Glenn, MD, MPH 11/01/2021, 10:27 AM

## 2021-11-01 NOTE — Telephone Encounter (Signed)
-----   Message from Thornton Park, MD sent at 11/01/2021 10:49 AM EDT ----- Please schedule office follow-up with Amy in 2-3 weeks. Thanks.  KLB

## 2021-11-02 ENCOUNTER — Telehealth: Payer: Self-pay | Admitting: *Deleted

## 2021-11-02 NOTE — Telephone Encounter (Signed)
  Follow up Call-     11/01/2021    9:38 AM  Call back number  Post procedure Call Back phone  # [phone]  Permission to leave phone message Yes     Patient questions:  Message left to call us if necessary.

## 2021-11-06 ENCOUNTER — Other Ambulatory Visit: Payer: Self-pay

## 2021-11-06 DIAGNOSIS — A048 Other specified bacterial intestinal infections: Secondary | ICD-10-CM

## 2021-11-06 MED ORDER — BISMUTH/METRONIDAZ/TETRACYCLIN 140-125-125 MG PO CAPS: 3.0000 | ORAL_CAPSULE | Freq: Three times a day (TID) | ORAL | 0 refills | Status: DC

## 2021-11-28 ENCOUNTER — Encounter: Payer: Self-pay | Admitting: Physician Assistant

## 2021-11-28 ENCOUNTER — Other Ambulatory Visit: Payer: Managed Care, Other (non HMO)

## 2021-11-28 ENCOUNTER — Ambulatory Visit (INDEPENDENT_AMBULATORY_CARE_PROVIDER_SITE_OTHER): Payer: Managed Care, Other (non HMO) | Admitting: Physician Assistant

## 2021-11-28 VITALS — BP 110/72 | HR 105 | Ht 66.0 in | Wt 119.0 lb

## 2021-11-28 DIAGNOSIS — K219 Gastro-esophageal reflux disease without esophagitis: Secondary | ICD-10-CM | POA: Diagnosis not present

## 2021-11-28 DIAGNOSIS — A048 Other specified bacterial intestinal infections: Secondary | ICD-10-CM

## 2021-11-28 MED ORDER — PANTOPRAZOLE SODIUM 40 MG PO TBEC: 40.0000 mg | DELAYED_RELEASE_TABLET | Freq: Every day | ORAL | 6 refills | Status: DC

## 2021-11-28 NOTE — Progress Notes (Signed)
Subjective:    Patient ID: Linda Sims, female    DOB: 1980/11/27, 41 y.o.   MRN: [id number]  HPI Linda Sims is a pleasant 41 year old female, who comes in today for follow-up after undergoing recent EGD per Dr. Tarri Glenn.  She was seen by myself initially on 09/26/2021 with complaints of epigastric and subxiphoid discomfort and vague dysphagia. She had been started on Protonix 40 mg p.o. every morning. EGD on 11/01/2021 was normal other than gastric biopsies showing H. Pylori.  Patient was started on a course of Pylera, which she completed.  She says that she stopped most of her other vitamins and supplements during that time, and has now been off of Protonix over the past week. Interestingly she had complained of her upper abdominal discomfort bloating and dyspepsia correlating with her menstrual cycle, usually occurring just prior to her menses and then improving a few days after onset of menses.  Over this past month she has not had any abdominal pain and did not have any symptoms just prior to her menstrual period She is still having issues with acid reflux type symptoms, that have not been severe.  Review of Systems.Pertinent positive and negative review of systems were noted in the above HPI section.  All other review of systems was otherwise negative.   Outpatient Encounter Medications as of 11/28/2021  Medication Sig   Bismuth/Metronidaz/Tetracyclin (PYLERA) 140-125-125 MG CAPS Take 3 capsules by mouth 4 (four) times daily -  before meals and at bedtime for 10 days.   Cholecalciferol (VITAMIN D3) 2000 UNITS capsule Take 1 capsule (2,000 Units total) by mouth daily. (Patient not taking: Reported on 11/28/2021)   COLLAGEN PO Take 1 capsule by mouth daily at 6 PM. (Patient not taking: Reported on 11/28/2021)   CRANBERRY CONCENTRATE PO Take by mouth. (Patient not taking: Reported on 11/28/2021)   ELDERBERRY PO Take by mouth. (Patient not taking: Reported on 11/28/2021)   Multiple Vitamins-Minerals (WOMENS  MULTIVITAMIN PO) Take 1 tablet by mouth daily at 6 PM. (Patient not taking: Reported on 11/28/2021)   pantoprazole (PROTONIX) 40 MG tablet Take 1 tablet (40 mg total) by mouth daily before breakfast.   [DISCONTINUED] pantoprazole (PROTONIX) 40 MG tablet Take 1 tablet (40 mg total) by mouth daily before breakfast. (Patient not taking: Reported on 11/28/2021)   No facility-administered encounter medications on file as of 11/28/2021.   No Known Allergies Patient Active Problem List   Diagnosis Date Noted   Epigastric pain 08/24/2021   Neoplasm of uncertain behavior of skin 11/17/2020   Arthralgia 11/17/2020   Shingles 05/19/2019   Supervision of other normal pregnancy 04/30/2014   Active labor 04/29/2014   Well adult exam 11/11/2013   Social History   Socioeconomic History   Marital status: Married    Spouse name: Not on file   Number of children: Not on file   Years of education: Not on file   Highest education level: Not on file  Occupational History   Not on file  Tobacco Use   Smoking status: Never   Smokeless tobacco: Never  Vaping Use   Vaping Use: Never used  Substance and Sexual Activity   Alcohol use: No   Drug use: No   Sexual activity: Yes    Birth control/protection: Condom  Other Topics Concern   Not on file  Social History Narrative   Not on file   Social Determinants of Health   Financial Resource Strain: Not on file  Food Insecurity: Not on file  Transportation Needs: Not on file  Physical Activity: Not on file  Stress: Not on file  Social Connections: Not on file  Intimate Partner Violence: Not on file    Linda Sims's family history is not on file.      Objective:    Vitals:   11/28/21 0920  BP: 110/72  Pulse: (!) 105    Physical Exam Well-developed well-nourished white female in no acute distress.  Height, Weight, 119 BMI 19.2  HEENT; nontraumatic normocephalic, EOMI, PE R LA, sclera anicteric. Oropharynx; not examined today Neck; supple,  no JVD Cardiovascular; regular rate and rhythm with S1-S2, no murmur rub or gallop Pulmonary; Clear bilaterally Abdomen; soft, nontender, nondistended, no palpable mass or hepatosplenomegaly, bowel sounds are  Neuro/Psych; alert and oriented x4, grossly nonfocal mood and affect appropriate        Assessment & Plan:   #21 41 year old female initially presenting with dyspepsia/epigastric and subxiphoid discomfort with exacerbation correlating with her menses.  She does not have any previous diagnosis of endometriosis.  EGD was negative other than gastric biopsies positive for H. pylori.  She has completed a course of Pylera.  Over the past 1 month she has not been having any ongoing symptoms and did not have any symptoms at onset of her most recent menstrual period  She continues to have intermittent GERD symptoms, has been off of Protonix over the past week or so.  Plan; patient will complete H. pylori stool antigen after she has been off of Protonix for 2 weeks, then has been asked to resume Protonix 40 mg p.o. every morning AC breakfast to help control her reflux symptoms.  Will observe over the next couple of months if she has recurrence of the upper abdominal discomfort correlating with her menstrual cycle, I have advised her to make appointment to see her gynecologist then for further workup and consideration of endometriosis playing a role in the symptoms.  Will plan to see her back in the office in 3 to 4 months, sooner as needed.   Buel Molder S Salomon Ganser PA-C 11/28/2021   Cc: Plotnikov, Evie Lacks, MD

## 2021-11-28 NOTE — Patient Instructions (Addendum)
If you are age 39 or younger, your body mass index should be between 19-25. Your Body mass index is 19.21 kg/m. If this is out of the aformentioned range listed, please consider follow up with your Primary Care Provider.  ________________________________________________________  The [facility] providers would like to encourage you to use [facility] to communicate with providers for non-urgent requests or questions.  Due to long hold times on the telephone, sending your provider a message by [facility] may be a faster and more efficient way to get a response.  Please allow 48 business hours for a response.  Please remember that this is for non-urgent requests.  _______________________________________________________  Your provider has requested that you go to the basement level for lab work before leaving today. Press "B" on the elevator. The lab is located at the first door on the left as you exit the elevator.  Continue to hold your Pantoprazole for another week. Once you have completed the H. Pylori test you may restart your Pantoprazole. *Refills have been sent to your pharmacy  Call the office to schedule a follow up with Nicoletta Ba, PA-C or Dr. Tarri Glenn in November or  December.  Thank you for entrusting me with your care and choosing [facility].  Amy Esterwood, PA-C

## 2021-11-29 NOTE — Progress Notes (Signed)
Reviewed and agree with management plans. ? ?Dennys Guin L. Ricco Dershem, MD, MPH  ?

## 2021-12-06 ENCOUNTER — Other Ambulatory Visit: Payer: Managed Care, Other (non HMO)

## 2021-12-06 DIAGNOSIS — A048 Other specified bacterial intestinal infections: Secondary | ICD-10-CM

## 2021-12-06 DIAGNOSIS — K219 Gastro-esophageal reflux disease without esophagitis: Secondary | ICD-10-CM

## 2021-12-08 LAB — H. PYLORI ANTIGEN, STOOL: H pylori Ag, Stl: NEGATIVE

## 2022-10-17 ENCOUNTER — Ambulatory Visit (INDEPENDENT_AMBULATORY_CARE_PROVIDER_SITE_OTHER): Payer: Managed Care, Other (non HMO) | Admitting: Internal Medicine

## 2022-10-17 ENCOUNTER — Encounter: Payer: Self-pay | Admitting: Internal Medicine

## 2022-10-17 VITALS — BP 110/68 | HR 78 | Temp 98.3°F | Ht 66.0 in | Wt 123.0 lb

## 2022-10-17 DIAGNOSIS — Z8619 Personal history of other infectious and parasitic diseases: Secondary | ICD-10-CM | POA: Insufficient documentation

## 2022-10-17 DIAGNOSIS — L02215 Cutaneous abscess of perineum: Secondary | ICD-10-CM | POA: Insufficient documentation

## 2022-10-17 DIAGNOSIS — Z0000 Encounter for general adult medical examination without abnormal findings: Secondary | ICD-10-CM | POA: Diagnosis not present

## 2022-10-17 MED ORDER — PANTOPRAZOLE SODIUM 40 MG PO TBEC: 40.0000 mg | DELAYED_RELEASE_TABLET | Freq: Every day | ORAL | 6 refills | Status: AC

## 2022-10-17 MED ORDER — VITAMIN D3 50 MCG (2000 UT) PO CAPS: 2000.0000 [IU] | ORAL_CAPSULE | Freq: Every day | ORAL | 3 refills | Status: AC

## 2022-10-17 NOTE — Progress Notes (Signed)
Subjective:  Patient ID: Linda Sims, female    DOB: 09-07-1980  Age: 42 y.o. MRN: [id number]  CC: Annual Exam   HPI Linda Sims presents for a well exam  Outpatient Medications Prior to Visit  Medication Sig Dispense Refill   Bismuth/Metronidaz/Tetracyclin (PYLERA) 140-125-125 MG CAPS Take 3 capsules by mouth 4 (four) times daily -  before meals and at bedtime for 10 days. 120 capsule 0   Cholecalciferol (VITAMIN D3) 2000 UNITS capsule Take 1 capsule (2,000 Units total) by mouth daily. (Patient not taking: Reported on 11/28/2021) 100 capsule 3   COLLAGEN PO Take 1 capsule by mouth daily at 6 PM. (Patient not taking: Reported on 11/28/2021)     CRANBERRY CONCENTRATE PO Take by mouth. (Patient not taking: Reported on 11/28/2021)     ELDERBERRY PO Take by mouth. (Patient not taking: Reported on 11/28/2021)     Multiple Vitamins-Minerals (WOMENS MULTIVITAMIN PO) Take 1 tablet by mouth daily at 6 PM. (Patient not taking: Reported on 11/28/2021)     pantoprazole (PROTONIX) 40 MG tablet Take 1 tablet (40 mg total) by mouth daily before breakfast. 30 tablet 6   No facility-administered medications prior to visit.    ROS: Review of Systems  Constitutional:  Negative for activity change, appetite change, chills, fatigue and unexpected weight change.  HENT:  Negative for congestion, mouth sores and sinus pressure.   Eyes:  Negative for visual disturbance.  Respiratory:  Negative for cough and chest tightness.   Gastrointestinal:  Negative for abdominal pain and nausea.  Genitourinary:  Negative for difficulty urinating, frequency and vaginal pain.  Musculoskeletal:  Negative for back pain and gait problem.  Skin:  Negative for pallor and rash.  Neurological:  Negative for dizziness, tremors, weakness, numbness and headaches.  Psychiatric/Behavioral:  Negative for confusion and sleep disturbance.     Objective:  BP 110/68 (BP Location: Left Arm, Patient Position: Sitting, Cuff Size: Large)    Pulse 78   Temp 98.3 F (36.8 C) (Oral)   Ht 5\' 6"  (1.676 m)   Wt 123 lb (55.8 kg)   SpO2 98%   BMI 19.85 kg/m   BP Readings from Last 3 Encounters:  10/17/22 110/68  11/28/21 110/72  11/01/21 113/75    Wt Readings from Last 3 Encounters:  10/17/22 123 lb (55.8 kg)  11/28/21 119 lb (54 kg)  11/01/21 121 lb (54.9 kg)    Physical Exam Constitutional:      General: She is not in acute distress.    Appearance: Normal appearance. She is well-developed.  HENT:     Head: Normocephalic.     Right Ear: External ear normal.     Left Ear: External ear normal.     Nose: Nose normal.  Eyes:     General:        Right eye: No discharge.        Left eye: No discharge.     Conjunctiva/sclera: Conjunctivae normal.     Pupils: Pupils are equal, round, and reactive to light.  Neck:     Thyroid: No thyromegaly.     Vascular: No JVD.     Trachea: No tracheal deviation.  Cardiovascular:     Rate and Rhythm: Normal rate and regular rhythm.     Heart sounds: Normal heart sounds.  Pulmonary:     Effort: No respiratory distress.     Breath sounds: No stridor. No wheezing.  Abdominal:     General: Bowel sounds are normal. There is  no distension.     Palpations: Abdomen is soft. There is no mass.     Tenderness: There is no abdominal tenderness. There is no guarding or rebound.  Musculoskeletal:        General: No tenderness.     Cervical back: Normal range of motion and neck supple. No rigidity.  Lymphadenopathy:     Cervical: No cervical adenopathy.  Skin:    Findings: No erythema or rash.  Neurological:     Cranial Nerves: No cranial nerve deficit.     Motor: No abnormal muscle tone.     Coordination: Coordination normal.     Deep Tendon Reflexes: Reflexes normal.  Psychiatric:        Behavior: Behavior normal.        Thought Content: Thought content normal.        Judgment: Judgment normal.     Lab Results  Component Value Date   WBC 6.7 08/24/2021   HGB 14.1 08/24/2021    HCT 40.5 08/24/2021   PLT 209.0 08/24/2021   GLUCOSE 86 08/24/2021   CHOL 171 11/17/2020   TRIG 46.0 11/17/2020   HDL 75.10 11/17/2020   LDLCALC 87 11/17/2020   ALT 20 08/24/2021   AST 17 08/24/2021   NA 138 08/24/2021   K 4.0 08/24/2021   CL 102 08/24/2021   CREATININE 0.80 08/24/2021   BUN 15 08/24/2021   CO2 29 08/24/2021   TSH 0.94 08/24/2021    US Renal  Result Date: 11/25/2020 CLINICAL DATA:  Recurrent UTI EXAM: RENAL / URINARY TRACT ULTRASOUND COMPLETE COMPARISON:  None. FINDINGS: Right Kidney: Renal measurements: 10.9 x 4.2 x 4 cm = volume: 95.9 mL. Echogenicity within normal limits. No mass or hydronephrosis visualized. Left Kidney: Renal measurements: 10.7 x 5.8 x 4.8 cm = volume: 148.2 mL. Echogenicity within normal limits. No mass or hydronephrosis visualized. Bladder: Appears normal for degree of bladder distention. Other: None. IMPRESSION: Negative renal ultrasound Electronically Signed   By: Jasmine Pang M.D.   On: 11/25/2020 20:21    Assessment & Plan:   Problem List Items Addressed This Visit     Well adult exam - Primary    We discussed age appropriate health related issues, including available/recomended screening tests and vaccinations. We discussed a need for adhering to healthy diet and exercise. All questions were answered. Yearly GYN exam      Relevant Orders   TSH   Urinalysis   CBC with Differential/Platelet   Lipid panel   Comprehensive metabolic panel      Meds ordered this encounter  Medications   Cholecalciferol (VITAMIN D3) 50 MCG (2000 UT) capsule    Sig: Take 1 capsule (2,000 Units total) by mouth daily.    Dispense:  100 capsule    Refill:  3   pantoprazole (PROTONIX) 40 MG tablet    Sig: Take 1 tablet (40 mg total) by mouth daily before breakfast.    Dispense:  30 tablet    Refill:  6      Follow-up: Return in about 1 year (around 10/17/2023) for Wellness Exam.  Sonda Primes, MD

## 2022-10-17 NOTE — Assessment & Plan Note (Signed)
We discussed age appropriate health related issues, including available/recomended screening tests and vaccinations. We discussed a need for adhering to healthy diet and exercise. All questions were answered. Yearly GYN exam 

## 2022-10-18 ENCOUNTER — Other Ambulatory Visit: Payer: Managed Care, Other (non HMO)

## 2022-10-18 DIAGNOSIS — Z0000 Encounter for general adult medical examination without abnormal findings: Secondary | ICD-10-CM | POA: Diagnosis not present

## 2022-10-18 LAB — CBC WITH DIFFERENTIAL/PLATELET
Basophils Absolute: 0 10*3/uL (ref 0.0–0.1)
Basophils Relative: 0.7 % (ref 0.0–3.0)
Eosinophils Absolute: 0 10*3/uL (ref 0.0–0.7)
Eosinophils Relative: 0.7 % (ref 0.0–5.0)
HCT: 39.8 % (ref 36.0–46.0)
Hemoglobin: 13.6 g/dL (ref 12.0–15.0)
Lymphocytes Relative: 38.1 % (ref 12.0–46.0)
Lymphs Abs: 1.9 10*3/uL (ref 0.7–4.0)
MCHC: 34.1 g/dL (ref 30.0–36.0)
MCV: 89.6 fl (ref 78.0–100.0)
Monocytes Absolute: 0.4 10*3/uL (ref 0.1–1.0)
Monocytes Relative: 8.3 % (ref 3.0–12.0)
Neutro Abs: 2.6 10*3/uL (ref 1.4–7.7)
Neutrophils Relative %: 52.2 % (ref 43.0–77.0)
Platelets: 186 10*3/uL (ref 150.0–400.0)
RBC: 4.44 Mil/uL (ref 3.87–5.11)
RDW: 12.6 % (ref 11.5–15.5)
WBC: 4.9 10*3/uL (ref 4.0–10.5)

## 2022-10-18 LAB — COMPREHENSIVE METABOLIC PANEL
ALT: 17 U/L (ref 0–35)
AST: 15 U/L (ref 0–37)
Albumin: 4.4 g/dL (ref 3.5–5.2)
Alkaline Phosphatase: 23 U/L — ABNORMAL LOW (ref 39–117)
BUN: 10 mg/dL (ref 6–23)
CO2: 29 mEq/L (ref 19–32)
Calcium: 9.6 mg/dL (ref 8.4–10.5)
Chloride: 103 mEq/L (ref 96–112)
Creatinine, Ser: 0.76 mg/dL (ref 0.40–1.20)
GFR: 98.46 mL/min (ref >60.00)
Glucose, Bld: 92 mg/dL (ref 70–99)
Potassium: 4.6 mEq/L (ref 3.5–5.1)
Sodium: 137 mEq/L (ref 135–145)
Total Bilirubin: 0.7 mg/dL (ref 0.2–1.2)
Total Protein: 7.1 g/dL (ref 6.0–8.3)

## 2022-10-18 LAB — LIPID PANEL
Cholesterol: 182 mg/dL (ref 0–200)
HDL: 78.5 mg/dL (ref >39.00)
LDL Cholesterol: 92 mg/dL (ref 0–99)
NonHDL: 103.48
Total CHOL/HDL Ratio: 2
Triglycerides: 58 mg/dL (ref 0.0–149.0)
VLDL: 11.6 mg/dL (ref 0.0–40.0)

## 2022-10-18 LAB — URINALYSIS
Bilirubin Urine: NEGATIVE
Hgb urine dipstick: NEGATIVE
Ketones, ur: NEGATIVE
Leukocytes,Ua: NEGATIVE
Nitrite: NEGATIVE
Specific Gravity, Urine: <=1.005 — AB (ref 1.000–1.030)
Total Protein, Urine: NEGATIVE
Urine Glucose: NEGATIVE
Urobilinogen, UA: 0.2 (ref 0.0–1.0)
pH: 7 (ref 5.0–8.0)

## 2022-10-18 LAB — TSH: TSH: 0.98 u[IU]/mL (ref 0.35–5.50)

## 2022-10-22 ENCOUNTER — Encounter: Payer: Self-pay | Admitting: Internal Medicine

## 2022-10-31 ENCOUNTER — Other Ambulatory Visit: Payer: Self-pay | Admitting: Internal Medicine

## 2022-10-31 DIAGNOSIS — R748 Abnormal levels of other serum enzymes: Secondary | ICD-10-CM

## 2022-11-01 ENCOUNTER — Other Ambulatory Visit (INDEPENDENT_AMBULATORY_CARE_PROVIDER_SITE_OTHER): Payer: Managed Care, Other (non HMO)

## 2022-11-01 DIAGNOSIS — R748 Abnormal levels of other serum enzymes: Secondary | ICD-10-CM | POA: Diagnosis not present

## 2022-11-01 DIAGNOSIS — E559 Vitamin D deficiency, unspecified: Secondary | ICD-10-CM

## 2022-11-01 LAB — COMPREHENSIVE METABOLIC PANEL
ALT: 15 U/L (ref 0–35)
AST: 16 U/L (ref 0–37)
Albumin: 4.5 g/dL (ref 3.5–5.2)
Alkaline Phosphatase: 27 U/L — ABNORMAL LOW (ref 39–117)
BUN: 14 mg/dL (ref 6–23)
CO2: 31 mEq/L (ref 19–32)
Calcium: 9.6 mg/dL (ref 8.4–10.5)
Chloride: 101 mEq/L (ref 96–112)
Creatinine, Ser: 0.82 mg/dL (ref 0.40–1.20)
GFR: 89.86 mL/min (ref >60.00)
Glucose, Bld: 92 mg/dL (ref 70–99)
Potassium: 4.3 mEq/L (ref 3.5–5.1)
Sodium: 136 mEq/L (ref 135–145)
Total Bilirubin: 0.7 mg/dL (ref 0.2–1.2)
Total Protein: 7.1 g/dL (ref 6.0–8.3)

## 2022-11-01 LAB — VITAMIN D 25 HYDROXY (VIT D DEFICIENCY, FRACTURES): VITD: 22.42 ng/mL — ABNORMAL LOW (ref 30.00–100.00)

## 2022-11-01 LAB — VITAMIN B12: Vitamin B-12: 576 pg/mL (ref 211–911)

## 2022-11-03 DIAGNOSIS — E559 Vitamin D deficiency, unspecified: Secondary | ICD-10-CM | POA: Insufficient documentation

## 2022-11-03 DIAGNOSIS — R748 Abnormal levels of other serum enzymes: Secondary | ICD-10-CM | POA: Insufficient documentation

## 2022-11-03 MED ORDER — VITAMIN D (ERGOCALCIFEROL) 1.25 MG (50000 UNIT) PO CAPS: 50000.0000 [IU] | ORAL_CAPSULE | ORAL | 0 refills | Status: AC

## 2022-11-10 LAB — IRON,TIBC AND FERRITIN PANEL
%SAT: 34 % (calc) (ref 16–45)
Ferritin: 7 ng/mL — ABNORMAL LOW (ref 16–154)
Iron: 104 ug/dL (ref 40–190)
TIBC: 308 mcg/dL (calc) (ref 250–450)

## 2022-11-10 LAB — ZINC: Zinc: 75 ug/dL (ref 60–130)

## 2022-11-10 LAB — CERULOPLASMIN: Ceruloplasmin: 21 mg/dL (ref 14–48)

## 2023-04-04 IMAGING — US US RENAL
1 series · 14 of 25 positions shown · non-contrast
Comparison: None.

CLINICAL DATA: Recurrent UTI

EXAM:
RENAL / URINARY TRACT ULTRASOUND COMPLETE

[Series 1: us renal · 0.20mm/px · 14 of 42 slices shown]
[im 1/42]
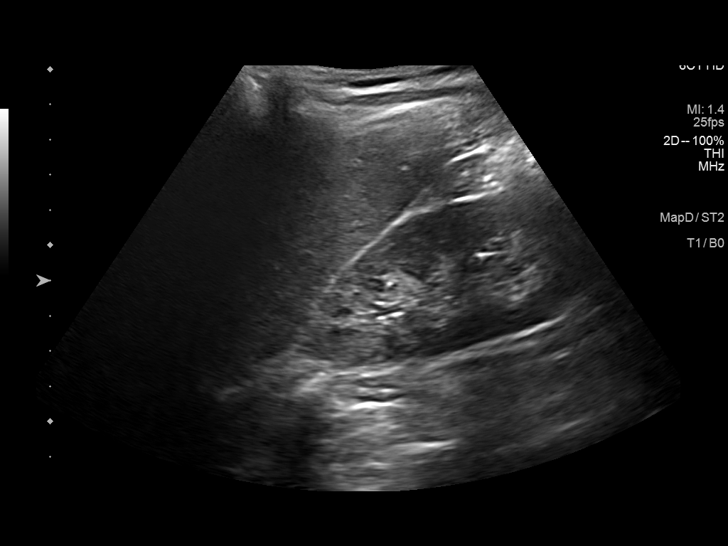
[im 4/42]
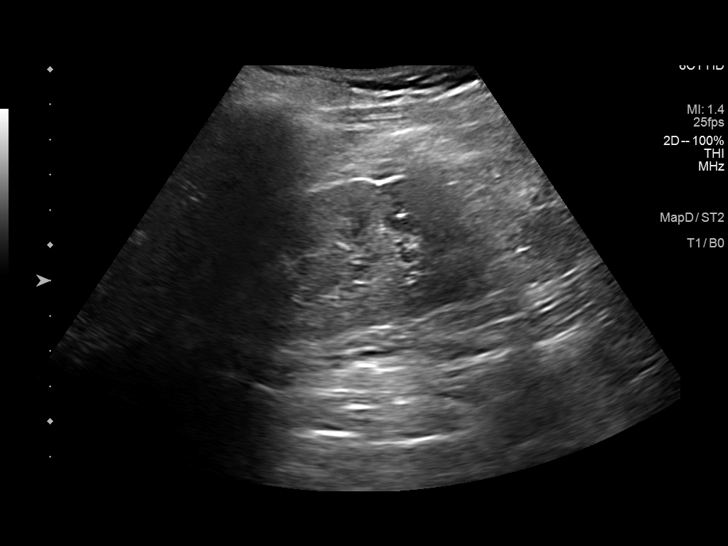
[im 7/42]
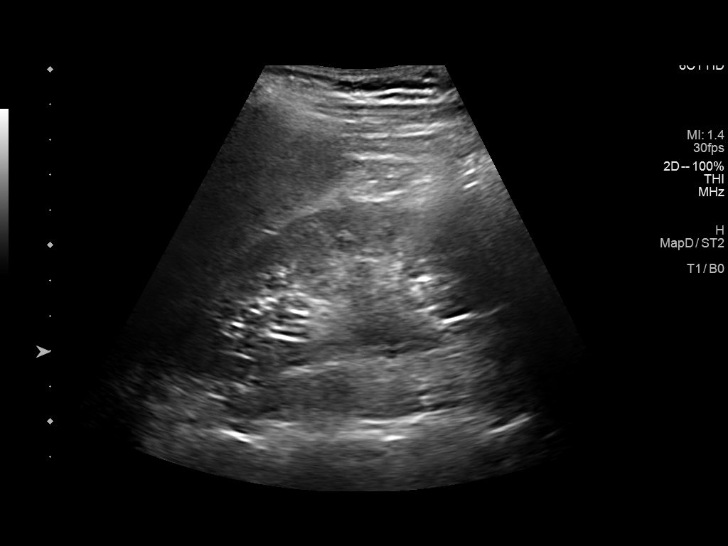
[im 11/42]
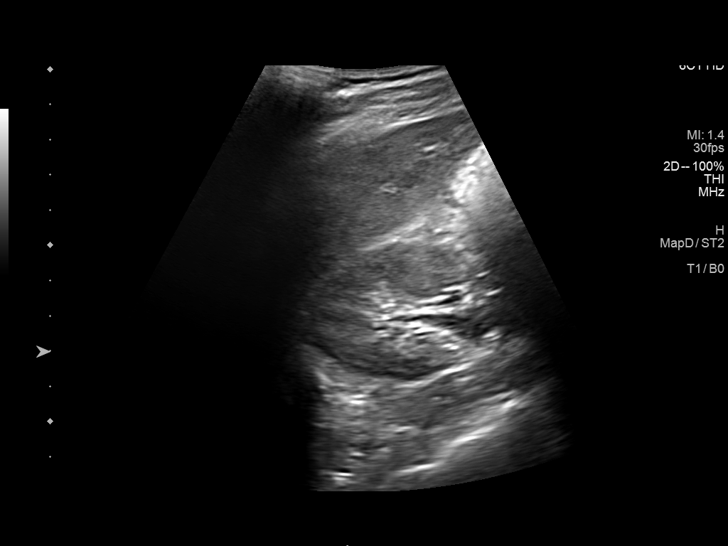
[im 14/42]
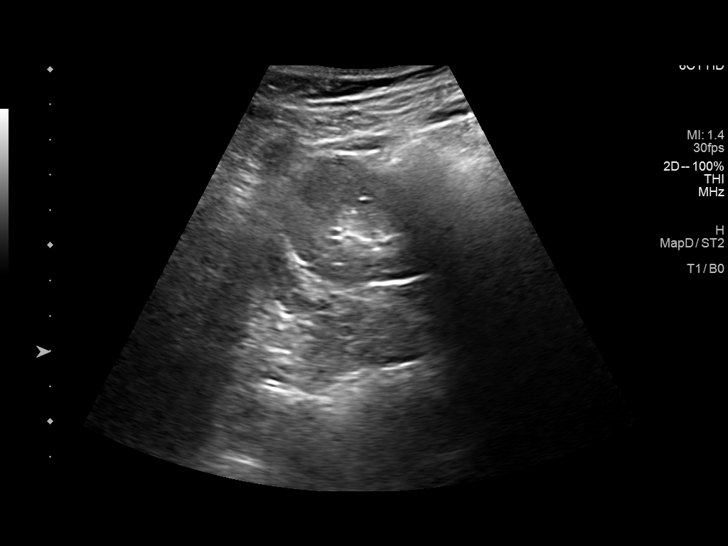
[im 16/42]
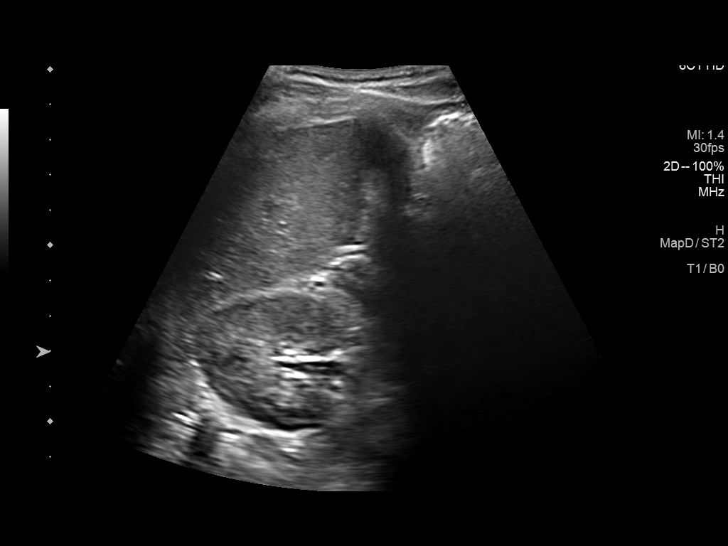
[im 19/42]
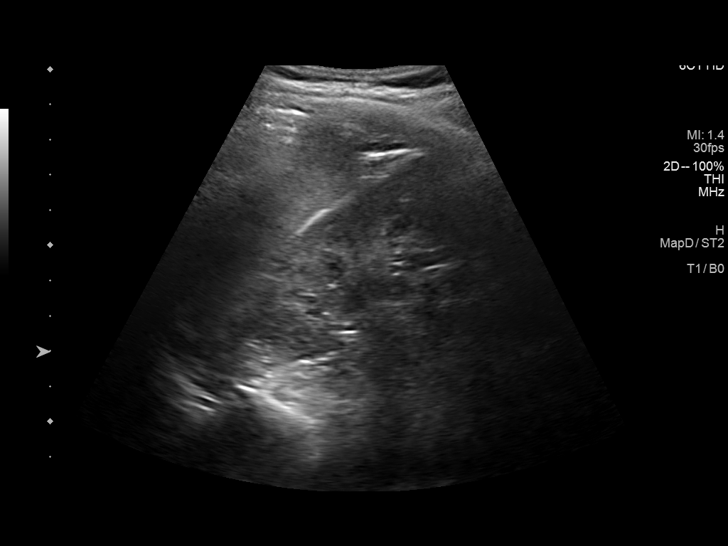
[im 23/42]
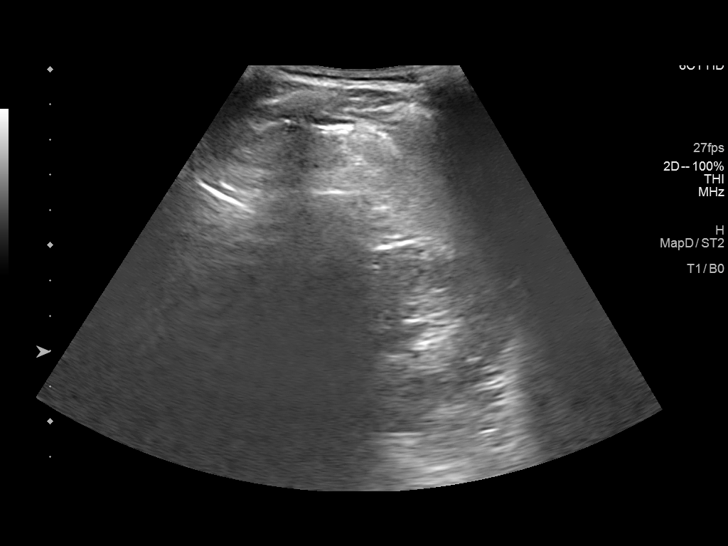
[im 26/42]
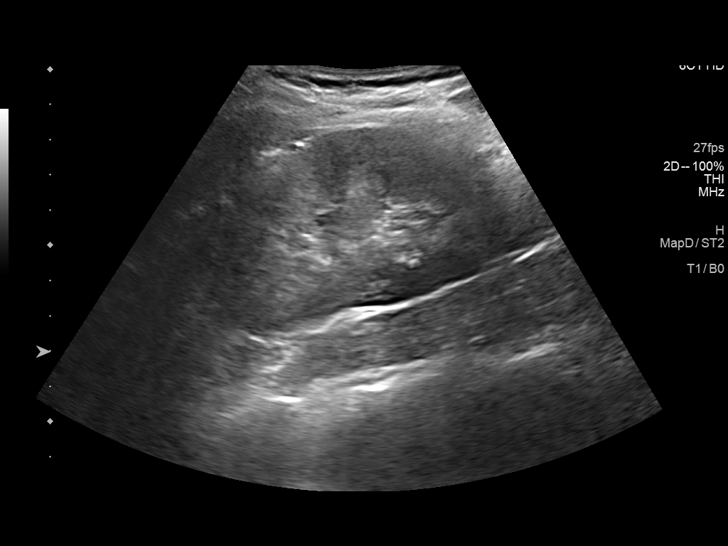
[im 28/42]
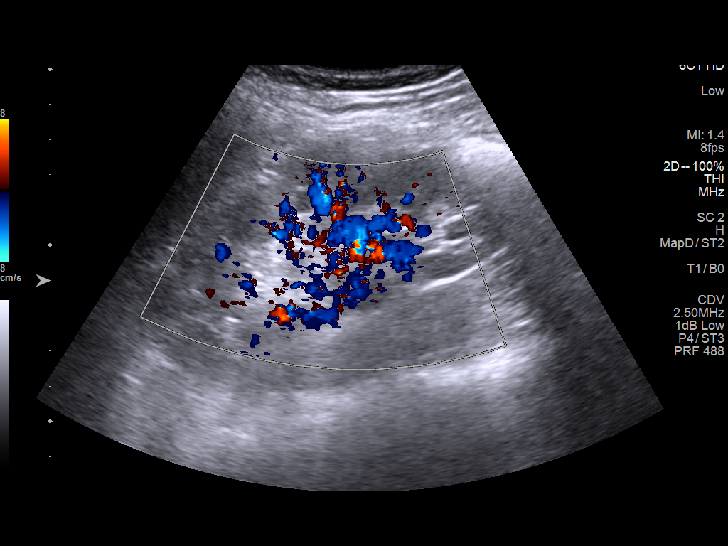
[im 31/42]
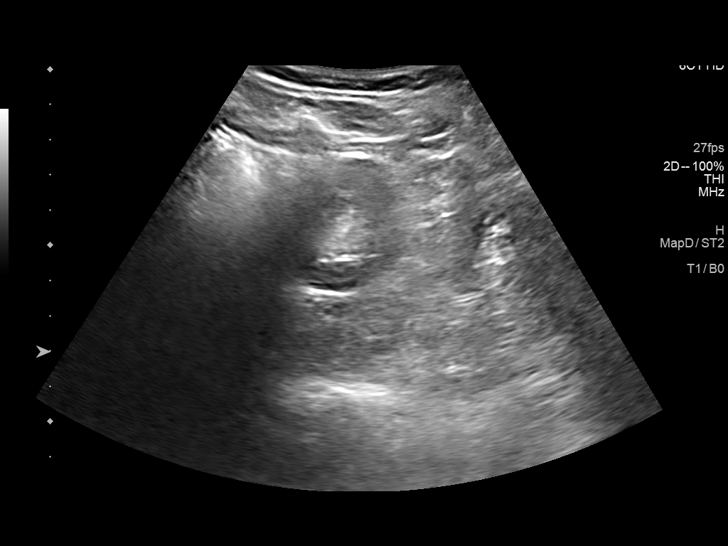
[im 35/42]
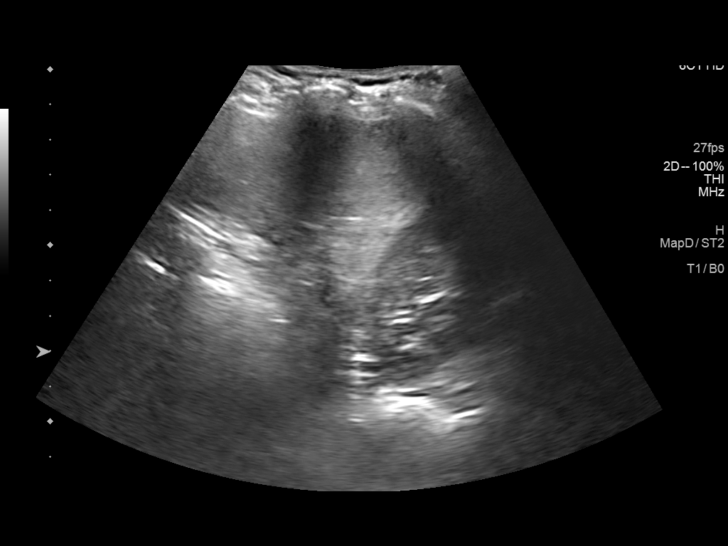
[im 38/42]
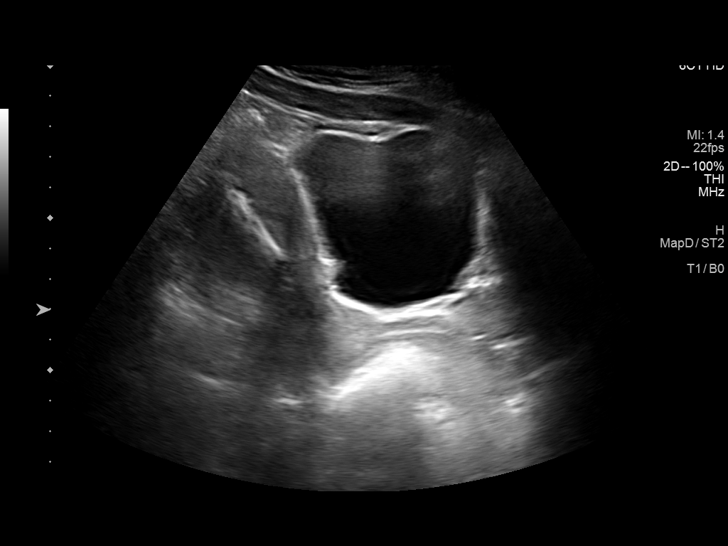
[im 42/42]
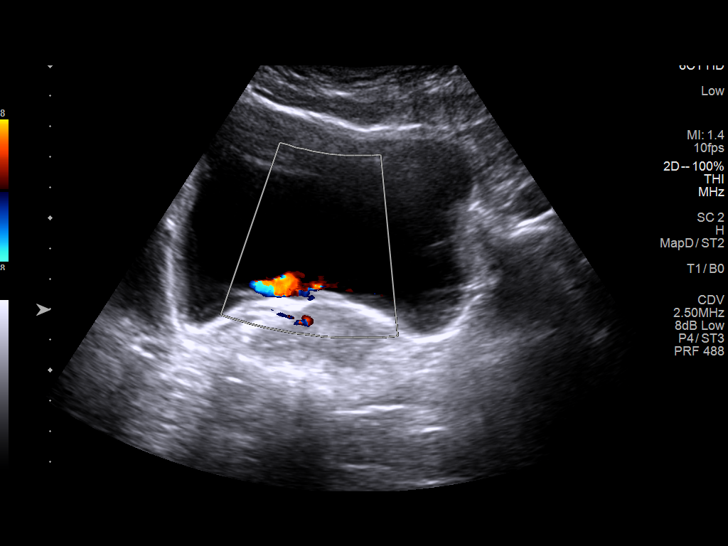

[14 of 25 positions shown; findings below may reference images not displayed]

FINDINGS: Right Kidney:

Renal measurements: 10.9 x 4.2 x 4 cm = volume: 95.9 mL.
Echogenicity within normal limits. No mass or hydronephrosis
visualized.

Left Kidney:

Renal measurements: 10.7 x 5.8 x 4.8 cm = volume: 148.2 mL.
Echogenicity within normal limits. No mass or hydronephrosis
visualized.

Bladder:

Appears normal for degree of bladder distention.

Other:

None.
IMPRESSION: Negative renal ultrasound

## 2023-04-26 ENCOUNTER — Other Ambulatory Visit: Payer: Self-pay | Admitting: Obstetrics and Gynecology

## 2023-04-26 DIAGNOSIS — N6310 Unspecified lump in the right breast, unspecified quadrant: Secondary | ICD-10-CM

## 2023-05-02 ENCOUNTER — Other Ambulatory Visit: Payer: Self-pay | Admitting: Obstetrics and Gynecology

## 2023-05-02 ENCOUNTER — Ambulatory Visit
Admission: RE | Admit: 2023-05-02 | Discharge: 2023-05-02 | Disposition: A | Discharge status: Home / Self Care | Payer: Managed Care, Other (non HMO) | Source: Ambulatory Visit | Attending: Obstetrics and Gynecology | Admitting: Obstetrics and Gynecology

## 2023-05-02 DIAGNOSIS — N6310 Unspecified lump in the right breast, unspecified quadrant: Secondary | ICD-10-CM

## 2023-05-02 DIAGNOSIS — R928 Other abnormal and inconclusive findings on diagnostic imaging of breast: Secondary | ICD-10-CM

## 2023-10-21 ENCOUNTER — Ambulatory Visit (INDEPENDENT_AMBULATORY_CARE_PROVIDER_SITE_OTHER): Payer: Managed Care, Other (non HMO) | Admitting: Internal Medicine

## 2023-10-21 ENCOUNTER — Encounter: Payer: Self-pay | Admitting: Internal Medicine

## 2023-10-21 VITALS — BP 104/76 | HR 70 | Temp 98.0°F | Ht 66.0 in | Wt 125.0 lb

## 2023-10-21 DIAGNOSIS — Z0000 Encounter for general adult medical examination without abnormal findings: Secondary | ICD-10-CM

## 2023-10-21 DIAGNOSIS — E559 Vitamin D deficiency, unspecified: Secondary | ICD-10-CM

## 2023-10-21 DIAGNOSIS — Z13220 Encounter for screening for lipoid disorders: Secondary | ICD-10-CM

## 2023-10-21 LAB — CBC WITH DIFFERENTIAL/PLATELET
Basophils Absolute: 0 10*3/uL (ref 0.0–0.1)
Basophils Relative: 0.5 % (ref 0.0–3.0)
Eosinophils Absolute: 0 10*3/uL (ref 0.0–0.7)
Eosinophils Relative: 0.6 % (ref 0.0–5.0)
HCT: 39.9 % (ref 36.0–46.0)
Hemoglobin: 13.9 g/dL (ref 12.0–15.0)
Lymphocytes Relative: 41.6 % (ref 12.0–46.0)
Lymphs Abs: 1.7 10*3/uL (ref 0.7–4.0)
MCHC: 35 g/dL (ref 30.0–36.0)
MCV: 89 fl (ref 78.0–100.0)
Monocytes Absolute: 0.4 10*3/uL (ref 0.1–1.0)
Monocytes Relative: 9 % (ref 3.0–12.0)
Neutro Abs: 2 10*3/uL (ref 1.4–7.7)
Neutrophils Relative %: 48.3 % (ref 43.0–77.0)
Platelets: 185 10*3/uL (ref 150.0–400.0)
RBC: 4.48 Mil/uL (ref 3.87–5.11)
RDW: 12.1 % (ref 11.5–15.5)
WBC: 4.2 10*3/uL (ref 4.0–10.5)

## 2023-10-21 LAB — COMPREHENSIVE METABOLIC PANEL WITH GFR
ALT: 19 U/L (ref 0–35)
AST: 18 U/L (ref 0–37)
Albumin: 4.7 g/dL (ref 3.5–5.2)
Alkaline Phosphatase: 21 U/L — ABNORMAL LOW (ref 39–117)
BUN: 15 mg/dL (ref 6–23)
CO2: 29 meq/L (ref 19–32)
Calcium: 9.3 mg/dL (ref 8.4–10.5)
Chloride: 102 meq/L (ref 96–112)
Creatinine, Ser: 0.53 mg/dL (ref 0.40–1.20)
GFR: 115.39 mL/min (ref >60.00)
Glucose, Bld: 90 mg/dL (ref 70–99)
Potassium: 4 meq/L (ref 3.5–5.1)
Sodium: 137 meq/L (ref 135–145)
Total Bilirubin: 0.8 mg/dL (ref 0.2–1.2)
Total Protein: 7.2 g/dL (ref 6.0–8.3)

## 2023-10-21 LAB — URINALYSIS, ROUTINE W REFLEX MICROSCOPIC
Bilirubin Urine: NEGATIVE
Ketones, ur: NEGATIVE
Nitrite: NEGATIVE
Specific Gravity, Urine: 1.01 (ref 1.000–1.030)
Total Protein, Urine: NEGATIVE
Urine Glucose: NEGATIVE
Urobilinogen, UA: 0.2 (ref 0.0–1.0)
pH: 6 (ref 5.0–8.0)

## 2023-10-21 LAB — LIPID PANEL
Cholesterol: 203 mg/dL — ABNORMAL HIGH (ref 0–200)
HDL: 83.1 mg/dL (ref >39.00)
LDL Cholesterol: 112 mg/dL — ABNORMAL HIGH (ref 0–99)
NonHDL: 119.74
Total CHOL/HDL Ratio: 2
Triglycerides: 39 mg/dL (ref 0.0–149.0)
VLDL: 7.8 mg/dL (ref 0.0–40.0)

## 2023-10-21 LAB — VITAMIN D 25 HYDROXY (VIT D DEFICIENCY, FRACTURES): VITD: 30.06 ng/mL (ref 30.00–100.00)

## 2023-10-21 LAB — TSH: TSH: 0.68 u[IU]/mL (ref 0.35–5.50)

## 2023-10-21 NOTE — Progress Notes (Signed)
 Subjective:  Patient ID: Linda Sims, female    DOB: 1980/08/12  Age: 43 y.o. MRN: [id number]  CC: Annual Exam   HPI Linda Sims presents for a well exam  Outpatient Medications Prior to Visit  Medication Sig Dispense Refill   Cholecalciferol (VITAMIN D3) 50 MCG (2000 UT) capsule Take 1 capsule (2,000 Units total) by mouth daily. 100 capsule 3   pantoprazole  (PROTONIX ) 40 MG tablet Take 1 tablet (40 mg total) by mouth daily before breakfast. 30 tablet 6   Vitamin D , Ergocalciferol , (DRISDOL ) 1.25 MG (50000 UNIT) CAPS capsule Take 1 capsule (50,000 Units total) by mouth every 7 (seven) days. 8 capsule 0   No facility-administered medications prior to visit.    ROS: Review of Systems  Constitutional:  Negative for activity change, appetite change, chills, fatigue and unexpected weight change.  HENT:  Negative for congestion, mouth sores and sinus pressure.   Eyes:  Negative for visual disturbance.  Respiratory:  Negative for cough and chest tightness.   Gastrointestinal:  Negative for abdominal pain and nausea.  Genitourinary:  Negative for difficulty urinating, frequency and vaginal pain.  Musculoskeletal:  Negative for back pain and gait problem.  Skin:  Negative for pallor and rash.  Neurological:  Negative for dizziness, tremors, weakness, numbness and headaches.  Psychiatric/Behavioral:  Negative for confusion and sleep disturbance.     Objective:  BP 104/76   Pulse 70   Temp 98 F (36.7 C) (Oral)   Ht 5' 6 (1.676 m)   Wt 125 lb (56.7 kg)   SpO2 99%   BMI 20.18 kg/m   BP Readings from Last 3 Encounters:  10/21/23 104/76  10/17/22 110/68  11/28/21 110/72    Wt Readings from Last 3 Encounters:  10/21/23 125 lb (56.7 kg)  10/17/22 123 lb (55.8 kg)  11/28/21 119 lb (54 kg)    Physical Exam Constitutional:      General: She is not in acute distress.    Appearance: She is well-developed.  HENT:     Head: Normocephalic.     Right Ear: External ear normal.      Left Ear: External ear normal.     Nose: Nose normal.   Eyes:     General:        Right eye: No discharge.        Left eye: No discharge.     Conjunctiva/sclera: Conjunctivae normal.     Pupils: Pupils are equal, round, and reactive to light.   Neck:     Thyroid : No thyromegaly.     Vascular: No JVD.     Trachea: No tracheal deviation.   Cardiovascular:     Rate and Rhythm: Normal rate and regular rhythm.     Heart sounds: Normal heart sounds.  Pulmonary:     Effort: No respiratory distress.     Breath sounds: No stridor. No wheezing.  Abdominal:     General: Bowel sounds are normal. There is no distension.     Palpations: Abdomen is soft. There is no mass.     Tenderness: There is no abdominal tenderness. There is no guarding or rebound.   Musculoskeletal:        General: No tenderness.     Cervical back: Normal range of motion and neck supple. No rigidity.  Lymphadenopathy:     Cervical: No cervical adenopathy.   Skin:    Findings: No erythema or rash.   Neurological:     Cranial Nerves: No cranial nerve  deficit.     Motor: No abnormal muscle tone.     Coordination: Coordination normal.     Deep Tendon Reflexes: Reflexes normal.   Psychiatric:        Behavior: Behavior normal.        Thought Content: Thought content normal.        Judgment: Judgment normal.     Lab Results  Component Value Date   WBC 4.9 10/18/2022   HGB 13.6 10/18/2022   HCT 39.8 10/18/2022   PLT 186.0 10/18/2022   GLUCOSE 92 11/01/2022   CHOL 182 10/18/2022   TRIG 58.0 10/18/2022   HDL 78.50 10/18/2022   LDLCALC 92 10/18/2022   ALT 15 11/01/2022   AST 16 11/01/2022   NA 136 11/01/2022   K 4.3 11/01/2022   CL 101 11/01/2022   CREATININE 0.82 11/01/2022   BUN 14 11/01/2022   CO2 31 11/01/2022   TSH 0.98 10/18/2022    MM 3D DIAGNOSTIC MAMMOGRAM BILATERAL BREAST Result Date: 05/02/2023 CLINICAL DATA:  43 year old female presenting for evaluation of a palpable lump in the  superior right breast. There is intermittent pain associated with this lump. This is the patient's baseline exam. EXAM: DIGITAL DIAGNOSTIC BILATERAL MAMMOGRAM WITH TOMOSYNTHESIS AND CAD; ULTRASOUND LEFT BREAST LIMITED; ULTRASOUND RIGHT BREAST LIMITED TECHNIQUE: Bilateral digital diagnostic mammography and breast tomosynthesis was performed. The images were evaluated with computer-aided detection. ; Targeted ultrasound examination of the left breast was performed.; Targeted ultrasound examination of the right breast was performed COMPARISON:  None available. ACR Breast Density Category a: The breasts are almost entirely fatty. FINDINGS: A BB indicating the palpable site of concern has been placed along the superior right breast. No suspicious mammographic findings are identified deep to the palpable marker. An asymmetry in the lateral posterior left breast appears to disperse on the spot compression tomosynthesis images. No suspicious calcifications, masses or areas of distortion are seen in the bilateral breasts. Ultrasound targeted to the palpable site in the superior right breast at approximately 1 o'clock, 10 cm from the nipple demonstrates normal fibroglandular tissue. No suspicious masses or areas of shadowing are identified. Ultrasound targeted to the lateral posterior left breast demonstrates normal fibroglandular tissue. Incidentally noted is a benign anechoic oval circumscribed cyst at 3 o'clock, 8 cm from the nipple measuring 5 mm. IMPRESSION: 1. No suspicious mammographic or targeted sonographic abnormalities are identified at the palpable site in the right breast. 2.  No evidence of malignancy in the bilateral breasts. RECOMMENDATION: 1. Clinical follow-up recommended for the palpable area of concern in the right breast. Any further workup should be based on clinical grounds. 2.  Screening mammogram in one year.(Code:SM-B-01Y) I have discussed the findings and recommendations with the patient. If  applicable, a reminder letter will be sent to the patient regarding the next appointment. BI-RADS CATEGORY  2: Benign. Electronically Signed   By: Rosaline Collet M.D.   On: 05/02/2023 16:10   US  LIMITED ULTRASOUND INCLUDING AXILLA RIGHT BREAST Result Date: 05/02/2023 CLINICAL DATA:  43 year old female presenting for evaluation of a palpable lump in the superior right breast. There is intermittent pain associated with this lump. This is the patient's baseline exam. EXAM: DIGITAL DIAGNOSTIC BILATERAL MAMMOGRAM WITH TOMOSYNTHESIS AND CAD; ULTRASOUND LEFT BREAST LIMITED; ULTRASOUND RIGHT BREAST LIMITED TECHNIQUE: Bilateral digital diagnostic mammography and breast tomosynthesis was performed. The images were evaluated with computer-aided detection. ; Targeted ultrasound examination of the left breast was performed.; Targeted ultrasound examination of the right breast was performed COMPARISON:  None available. ACR Breast Density Category a: The breasts are almost entirely fatty. FINDINGS: A BB indicating the palpable site of concern has been placed along the superior right breast. No suspicious mammographic findings are identified deep to the palpable marker. An asymmetry in the lateral posterior left breast appears to disperse on the spot compression tomosynthesis images. No suspicious calcifications, masses or areas of distortion are seen in the bilateral breasts. Ultrasound targeted to the palpable site in the superior right breast at approximately 1 o'clock, 10 cm from the nipple demonstrates normal fibroglandular tissue. No suspicious masses or areas of shadowing are identified. Ultrasound targeted to the lateral posterior left breast demonstrates normal fibroglandular tissue. Incidentally noted is a benign anechoic oval circumscribed cyst at 3 o'clock, 8 cm from the nipple measuring 5 mm. IMPRESSION: 1. No suspicious mammographic or targeted sonographic abnormalities are identified at the palpable site in the  right breast. 2.  No evidence of malignancy in the bilateral breasts. RECOMMENDATION: 1. Clinical follow-up recommended for the palpable area of concern in the right breast. Any further workup should be based on clinical grounds. 2.  Screening mammogram in one year.(Code:SM-B-01Y) I have discussed the findings and recommendations with the patient. If applicable, a reminder letter will be sent to the patient regarding the next appointment. BI-RADS CATEGORY  2: Benign. Electronically Signed   By: Rosaline Collet M.D.   On: 05/02/2023 16:10   US  LIMITED ULTRASOUND INCLUDING AXILLA LEFT BREAST  Result Date: 05/02/2023 CLINICAL DATA:  43 year old female presenting for evaluation of a palpable lump in the superior right breast. There is intermittent pain associated with this lump. This is the patient's baseline exam. EXAM: DIGITAL DIAGNOSTIC BILATERAL MAMMOGRAM WITH TOMOSYNTHESIS AND CAD; ULTRASOUND LEFT BREAST LIMITED; ULTRASOUND RIGHT BREAST LIMITED TECHNIQUE: Bilateral digital diagnostic mammography and breast tomosynthesis was performed. The images were evaluated with computer-aided detection. ; Targeted ultrasound examination of the left breast was performed.; Targeted ultrasound examination of the right breast was performed COMPARISON:  None available. ACR Breast Density Category a: The breasts are almost entirely fatty. FINDINGS: A BB indicating the palpable site of concern has been placed along the superior right breast. No suspicious mammographic findings are identified deep to the palpable marker. An asymmetry in the lateral posterior left breast appears to disperse on the spot compression tomosynthesis images. No suspicious calcifications, masses or areas of distortion are seen in the bilateral breasts. Ultrasound targeted to the palpable site in the superior right breast at approximately 1 o'clock, 10 cm from the nipple demonstrates normal fibroglandular tissue. No suspicious masses or areas of shadowing  are identified. Ultrasound targeted to the lateral posterior left breast demonstrates normal fibroglandular tissue. Incidentally noted is a benign anechoic oval circumscribed cyst at 3 o'clock, 8 cm from the nipple measuring 5 mm. IMPRESSION: 1. No suspicious mammographic or targeted sonographic abnormalities are identified at the palpable site in the right breast. 2.  No evidence of malignancy in the bilateral breasts. RECOMMENDATION: 1. Clinical follow-up recommended for the palpable area of concern in the right breast. Any further workup should be based on clinical grounds. 2.  Screening mammogram in one year.(Code:SM-B-01Y) I have discussed the findings and recommendations with the patient. If applicable, a reminder letter will be sent to the patient regarding the next appointment. BI-RADS CATEGORY  2: Benign. Electronically Signed   By: Rosaline Collet M.D.   On: 05/02/2023 16:10    Assessment & Plan:   Problem List Items Addressed This Visit  Well adult exam - Primary   We discussed age appropriate health related issues, including available/recomended screening tests and vaccinations. We discussed a need for adhering to healthy diet and exercise. All questions were answered. Yearly GYN exam      Relevant Orders   TSH   Urinalysis   CBC with Differential/Platelet   Lipid panel   Comprehensive metabolic panel with GFR   Vitamin D  deficiency   On Vit D      Relevant Orders   VITAMIN D  25 Hydroxy (Vit-D Deficiency, Fractures)      No orders of the defined types were placed in this encounter.     Follow-up: Return in about 1 year (around 10/20/2024) for Wellness Exam.  Marolyn Noel, MD

## 2023-10-21 NOTE — Progress Notes (Signed)
 Subjective:  Patient ID: Linda Sims, female    DOB: December 23, 1980  Age: 43 y.o. MRN: [id number]  CC: Annual Exam   HPI Linda Sims presents for a well exam  Outpatient Medications Prior to Visit  Medication Sig Dispense Refill   Cholecalciferol (VITAMIN D3) 50 MCG (2000 UT) capsule Take 1 capsule (2,000 Units total) by mouth daily. 100 capsule 3   pantoprazole  (PROTONIX ) 40 MG tablet Take 1 tablet (40 mg total) by mouth daily before breakfast. 30 tablet 6   Vitamin D , Ergocalciferol , (DRISDOL ) 1.25 MG (50000 UNIT) CAPS capsule Take 1 capsule (50,000 Units total) by mouth every 7 (seven) days. 8 capsule 0   No facility-administered medications prior to visit.    ROS: Review of Systems  Constitutional:  Negative for activity change, appetite change, chills, diaphoresis, fatigue, fever and unexpected weight change.  HENT:  Negative for congestion, dental problem, ear pain, hearing loss, mouth sores, postnasal drip, sinus pressure, sneezing, sore throat and voice change.   Eyes:  Negative for pain and visual disturbance.  Respiratory:  Negative for cough, chest tightness, wheezing and stridor.   Cardiovascular:  Negative for chest pain, palpitations and leg swelling.  Gastrointestinal:  Negative for abdominal distention, abdominal pain, blood in stool, nausea, rectal pain and vomiting.  Genitourinary:  Negative for decreased urine volume, difficulty urinating, dysuria, frequency, hematuria, menstrual problem, vaginal bleeding, vaginal discharge and vaginal pain.  Musculoskeletal:  Positive for arthralgias. Negative for back pain, gait problem, joint swelling and neck pain.  Skin:  Negative for color change, pallor, rash and wound.  Neurological:  Negative for dizziness, tremors, syncope, speech difficulty, weakness, light-headedness, numbness and headaches.  Hematological:  Negative for adenopathy.  Psychiatric/Behavioral:  Negative for behavioral problems, confusion, decreased  concentration, dysphoric mood, hallucinations, sleep disturbance and suicidal ideas. The patient is not nervous/anxious and is not hyperactive.     Objective:  BP 104/76   Pulse 70   Temp 98 F (36.7 C) (Oral)   Ht 5' 6 (1.676 m)   Wt 125 lb (56.7 kg)   SpO2 99%   BMI 20.18 kg/m   BP Readings from Last 3 Encounters:  10/21/23 104/76  10/17/22 110/68  11/28/21 110/72    Wt Readings from Last 3 Encounters:  10/21/23 125 lb (56.7 kg)  10/17/22 123 lb (55.8 kg)  11/28/21 119 lb (54 kg)    Physical Exam Constitutional:      General: She is not in acute distress.    Appearance: She is well-developed.  HENT:     Head: Normocephalic.     Right Ear: External ear normal.     Left Ear: External ear normal.     Nose: Nose normal.   Eyes:     General:        Right eye: No discharge.        Left eye: No discharge.     Conjunctiva/sclera: Conjunctivae normal.     Pupils: Pupils are equal, round, and reactive to light.   Neck:     Thyroid : No thyromegaly.     Vascular: No JVD.     Trachea: No tracheal deviation.   Cardiovascular:     Rate and Rhythm: Normal rate and regular rhythm.     Heart sounds: Normal heart sounds.  Pulmonary:     Effort: No respiratory distress.     Breath sounds: No stridor. No wheezing.  Abdominal:     General: Bowel sounds are normal. There is no distension.  Palpations: Abdomen is soft. There is no mass.     Tenderness: There is no abdominal tenderness. There is no guarding or rebound.   Musculoskeletal:        General: No tenderness.     Cervical back: Normal range of motion and neck supple. No rigidity.  Lymphadenopathy:     Cervical: No cervical adenopathy.   Skin:    Findings: No erythema or rash.   Neurological:     Cranial Nerves: No cranial nerve deficit.     Motor: No abnormal muscle tone.     Coordination: Coordination normal.     Deep Tendon Reflexes: Reflexes normal.   Psychiatric:        Behavior: Behavior normal.         Thought Content: Thought content normal.        Judgment: Judgment normal.     Lab Results  Component Value Date   WBC 4.9 10/18/2022   HGB 13.6 10/18/2022   HCT 39.8 10/18/2022   PLT 186.0 10/18/2022   GLUCOSE 92 11/01/2022   CHOL 182 10/18/2022   TRIG 58.0 10/18/2022   HDL 78.50 10/18/2022   LDLCALC 92 10/18/2022   ALT 15 11/01/2022   AST 16 11/01/2022   NA 136 11/01/2022   K 4.3 11/01/2022   CL 101 11/01/2022   CREATININE 0.82 11/01/2022   BUN 14 11/01/2022   CO2 31 11/01/2022   TSH 0.98 10/18/2022    MM 3D DIAGNOSTIC MAMMOGRAM BILATERAL BREAST Result Date: 05/02/2023 CLINICAL DATA:  43 year old female presenting for evaluation of a palpable lump in the superior right breast. There is intermittent pain associated with this lump. This is the patient's baseline exam. EXAM: DIGITAL DIAGNOSTIC BILATERAL MAMMOGRAM WITH TOMOSYNTHESIS AND CAD; ULTRASOUND LEFT BREAST LIMITED; ULTRASOUND RIGHT BREAST LIMITED TECHNIQUE: Bilateral digital diagnostic mammography and breast tomosynthesis was performed. The images were evaluated with computer-aided detection. ; Targeted ultrasound examination of the left breast was performed.; Targeted ultrasound examination of the right breast was performed COMPARISON:  None available. ACR Breast Density Category a: The breasts are almost entirely fatty. FINDINGS: A BB indicating the palpable site of concern has been placed along the superior right breast. No suspicious mammographic findings are identified deep to the palpable marker. An asymmetry in the lateral posterior left breast appears to disperse on the spot compression tomosynthesis images. No suspicious calcifications, masses or areas of distortion are seen in the bilateral breasts. Ultrasound targeted to the palpable site in the superior right breast at approximately 1 o'clock, 10 cm from the nipple demonstrates normal fibroglandular tissue. No suspicious masses or areas of shadowing are identified.  Ultrasound targeted to the lateral posterior left breast demonstrates normal fibroglandular tissue. Incidentally noted is a benign anechoic oval circumscribed cyst at 3 o'clock, 8 cm from the nipple measuring 5 mm. IMPRESSION: 1. No suspicious mammographic or targeted sonographic abnormalities are identified at the palpable site in the right breast. 2.  No evidence of malignancy in the bilateral breasts. RECOMMENDATION: 1. Clinical follow-up recommended for the palpable area of concern in the right breast. Any further workup should be based on clinical grounds. 2.  Screening mammogram in one year.(Code:SM-B-01Y) I have discussed the findings and recommendations with the patient. If applicable, a reminder letter will be sent to the patient regarding the next appointment. BI-RADS CATEGORY  2: Benign. Electronically Signed   By: Rosaline Collet M.D.   On: 05/02/2023 16:10   US  LIMITED ULTRASOUND INCLUDING AXILLA RIGHT BREAST Result Date: 05/02/2023 CLINICAL  DATA:  43 year old female presenting for evaluation of a palpable lump in the superior right breast. There is intermittent pain associated with this lump. This is the patient's baseline exam. EXAM: DIGITAL DIAGNOSTIC BILATERAL MAMMOGRAM WITH TOMOSYNTHESIS AND CAD; ULTRASOUND LEFT BREAST LIMITED; ULTRASOUND RIGHT BREAST LIMITED TECHNIQUE: Bilateral digital diagnostic mammography and breast tomosynthesis was performed. The images were evaluated with computer-aided detection. ; Targeted ultrasound examination of the left breast was performed.; Targeted ultrasound examination of the right breast was performed COMPARISON:  None available. ACR Breast Density Category a: The breasts are almost entirely fatty. FINDINGS: A BB indicating the palpable site of concern has been placed along the superior right breast. No suspicious mammographic findings are identified deep to the palpable marker. An asymmetry in the lateral posterior left breast appears to disperse on the  spot compression tomosynthesis images. No suspicious calcifications, masses or areas of distortion are seen in the bilateral breasts. Ultrasound targeted to the palpable site in the superior right breast at approximately 1 o'clock, 10 cm from the nipple demonstrates normal fibroglandular tissue. No suspicious masses or areas of shadowing are identified. Ultrasound targeted to the lateral posterior left breast demonstrates normal fibroglandular tissue. Incidentally noted is a benign anechoic oval circumscribed cyst at 3 o'clock, 8 cm from the nipple measuring 5 mm. IMPRESSION: 1. No suspicious mammographic or targeted sonographic abnormalities are identified at the palpable site in the right breast. 2.  No evidence of malignancy in the bilateral breasts. RECOMMENDATION: 1. Clinical follow-up recommended for the palpable area of concern in the right breast. Any further workup should be based on clinical grounds. 2.  Screening mammogram in one year.(Code:SM-B-01Y) I have discussed the findings and recommendations with the patient. If applicable, a reminder letter will be sent to the patient regarding the next appointment. BI-RADS CATEGORY  2: Benign. Electronically Signed   By: Rosaline Collet M.D.   On: 05/02/2023 16:10   US  LIMITED ULTRASOUND INCLUDING AXILLA LEFT BREAST  Result Date: 05/02/2023 CLINICAL DATA:  43 year old female presenting for evaluation of a palpable lump in the superior right breast. There is intermittent pain associated with this lump. This is the patient's baseline exam. EXAM: DIGITAL DIAGNOSTIC BILATERAL MAMMOGRAM WITH TOMOSYNTHESIS AND CAD; ULTRASOUND LEFT BREAST LIMITED; ULTRASOUND RIGHT BREAST LIMITED TECHNIQUE: Bilateral digital diagnostic mammography and breast tomosynthesis was performed. The images were evaluated with computer-aided detection. ; Targeted ultrasound examination of the left breast was performed.; Targeted ultrasound examination of the right breast was performed  COMPARISON:  None available. ACR Breast Density Category a: The breasts are almost entirely fatty. FINDINGS: A BB indicating the palpable site of concern has been placed along the superior right breast. No suspicious mammographic findings are identified deep to the palpable marker. An asymmetry in the lateral posterior left breast appears to disperse on the spot compression tomosynthesis images. No suspicious calcifications, masses or areas of distortion are seen in the bilateral breasts. Ultrasound targeted to the palpable site in the superior right breast at approximately 1 o'clock, 10 cm from the nipple demonstrates normal fibroglandular tissue. No suspicious masses or areas of shadowing are identified. Ultrasound targeted to the lateral posterior left breast demonstrates normal fibroglandular tissue. Incidentally noted is a benign anechoic oval circumscribed cyst at 3 o'clock, 8 cm from the nipple measuring 5 mm. IMPRESSION: 1. No suspicious mammographic or targeted sonographic abnormalities are identified at the palpable site in the right breast. 2.  No evidence of malignancy in the bilateral breasts. RECOMMENDATION: 1. Clinical follow-up recommended for the  palpable area of concern in the right breast. Any further workup should be based on clinical grounds. 2.  Screening mammogram in one year.(Code:SM-B-01Y) I have discussed the findings and recommendations with the patient. If applicable, a reminder letter will be sent to the patient regarding the next appointment. BI-RADS CATEGORY  2: Benign. Electronically Signed   By: Rosaline Collet M.D.   On: 05/02/2023 16:10    Assessment & Plan:   Problem List Items Addressed This Visit     Well adult exam - Primary   We discussed age appropriate health related issues, including available/recomended screening tests and vaccinations. We discussed a need for adhering to healthy diet and exercise. All questions were answered. Yearly GYN exam      Vitamin D   deficiency   On Vit D         No orders of the defined types were placed in this encounter.     Follow-up: Return in about 3 months (around 01/21/2024).  Marolyn Noel, MD

## 2023-10-21 NOTE — Assessment & Plan Note (Signed)
We discussed age appropriate health related issues, including available/recomended screening tests and vaccinations. We discussed a need for adhering to healthy diet and exercise. All questions were answered. Yearly GYN exam 

## 2023-10-21 NOTE — Assessment & Plan Note (Signed)
 On Vit D

## 2023-10-23 ENCOUNTER — Ambulatory Visit: Payer: Self-pay | Admitting: Internal Medicine

## 2024-03-09 ENCOUNTER — Ambulatory Visit (INDEPENDENT_AMBULATORY_CARE_PROVIDER_SITE_OTHER)

## 2024-03-09 ENCOUNTER — Encounter: Payer: Self-pay | Admitting: Internal Medicine

## 2024-03-09 ENCOUNTER — Ambulatory Visit: Admitting: Internal Medicine

## 2024-03-09 VITALS — BP 122/76 | HR 76 | Temp 98.4°F | Ht 66.0 in | Wt 128.8 lb

## 2024-03-09 DIAGNOSIS — M545 Low back pain, unspecified: Secondary | ICD-10-CM | POA: Diagnosis not present

## 2024-03-09 DIAGNOSIS — D171 Benign lipomatous neoplasm of skin and subcutaneous tissue of trunk: Secondary | ICD-10-CM | POA: Diagnosis not present

## 2024-03-09 DIAGNOSIS — G8929 Other chronic pain: Secondary | ICD-10-CM

## 2024-03-09 DIAGNOSIS — E559 Vitamin D deficiency, unspecified: Secondary | ICD-10-CM | POA: Diagnosis not present

## 2024-03-09 DIAGNOSIS — R102 Pelvic and perineal pain unspecified side: Secondary | ICD-10-CM | POA: Insufficient documentation

## 2024-03-09 LAB — URINALYSIS
Bilirubin Urine: NEGATIVE
Hgb urine dipstick: NEGATIVE
Ketones, ur: NEGATIVE
Leukocytes,Ua: NEGATIVE
Nitrite: NEGATIVE
Specific Gravity, Urine: 1.015 (ref 1.000–1.030)
Total Protein, Urine: NEGATIVE
Urine Glucose: NEGATIVE
Urobilinogen, UA: 0.2 (ref 0.0–1.0)
pH: 8 (ref 5.0–8.0)

## 2024-03-09 NOTE — Progress Notes (Signed)
 Subjective:  Patient ID: Linda Sims, female    DOB: [birth year]  Age: 43 y.o. MRN: [id number]  CC: Mass (Mass on lower back, above tail bone. Patient unsure of time of origin. General back pain but unsure if it is related)  She HPI Linda Sims presents for LBP of several weeks to months duration; couple months ago history of mobile lumps on both sides of her sacrum Status post R IUD implant about 6 years ago C/o pelvic pain x chronic, peri-menstrual.  Reports occasional pain during intercourse and spotting after intercourse.  Occasionally would miss a period  Outpatient Medications Prior to Visit  Medication Sig Dispense Refill   Cholecalciferol (VITAMIN D3) 50 MCG (2000 UT) capsule Take 1 capsule (2,000 Units total) by mouth daily. 100 capsule 3   pantoprazole  (PROTONIX ) 40 MG tablet Take 1 tablet (40 mg total) by mouth daily before breakfast. 30 tablet 6   Vitamin D , Ergocalciferol , (DRISDOL ) 1.25 MG (50000 UNIT) CAPS capsule Take 1 capsule (50,000 Units total) by mouth every 7 (seven) days. 8 capsule 0   No facility-administered medications prior to visit.    ROS: Review of Systems  Constitutional:  Negative for activity change, appetite change, chills, fatigue and unexpected weight change.  HENT:  Negative for congestion, mouth sores and sinus pressure.   Eyes:  Negative for visual disturbance.  Respiratory:  Negative for cough and chest tightness.   Cardiovascular:  Negative for leg swelling.  Gastrointestinal:  Negative for abdominal pain, anal bleeding, nausea and rectal pain.  Genitourinary:  Positive for pelvic pain and vaginal bleeding. Negative for difficulty urinating, frequency, urgency, vaginal discharge and vaginal pain.  Musculoskeletal:  Positive for back pain. Negative for gait problem.  Skin:  Negative for pallor and rash.  Neurological:  Negative for dizziness, tremors, weakness, numbness and headaches.  Hematological:  Negative for adenopathy. Does not  bruise/bleed easily.  Psychiatric/Behavioral:  Negative for confusion and sleep disturbance. The patient is not nervous/anxious.     Objective:  BP 122/76   Pulse 76   Temp 98.4 F (36.9 C)   Ht 5' 6 (1.676 m)   Wt 128 lb 12.8 oz (58.4 kg)   SpO2 98%   BMI 20.79 kg/m   BP Readings from Last 3 Encounters:  03/09/24 122/76  10/21/23 104/76  10/17/22 110/68    Wt Readings from Last 3 Encounters:  03/09/24 128 lb 12.8 oz (58.4 kg)  10/21/23 125 lb (56.7 kg)  10/17/22 123 lb (55.8 kg)    Physical Exam Constitutional:      General: She is not in acute distress.    Appearance: She is well-developed.  HENT:     Head: Normocephalic.     Right Ear: External ear normal.     Left Ear: External ear normal.     Nose: Nose normal.  Eyes:     General:        Right eye: No discharge.        Left eye: No discharge.     Conjunctiva/sclera: Conjunctivae normal.     Pupils: Pupils are equal, round, and reactive to light.  Neck:     Thyroid : No thyromegaly.     Vascular: No JVD.     Trachea: No tracheal deviation.  Cardiovascular:     Rate and Rhythm: Normal rate and regular rhythm.     Heart sounds: Normal heart sounds.  Pulmonary:     Effort: No respiratory distress.     Breath sounds: No stridor.  No wheezing.  Abdominal:     General: Bowel sounds are normal. There is no distension.     Palpations: Abdomen is soft. There is no mass.     Tenderness: There is no abdominal tenderness. There is no guarding or rebound.  Musculoskeletal:        General: No tenderness.     Cervical back: Normal range of motion and neck supple. No rigidity.  Lymphadenopathy:     Cervical: No cervical adenopathy.  Skin:    Findings: No erythema or rash.  Neurological:     Cranial Nerves: No cranial nerve deficit.     Motor: No abnormal muscle tone.     Coordination: Coordination normal.     Deep Tendon Reflexes: Reflexes normal.  Psychiatric:        Behavior: Behavior normal.        Thought  Content: Thought content normal.        Judgment: Judgment normal.    Oval, flat, mobile, grape sized subcutaneous tumors in the sacral iliac areas of the back sensitive to palpation Lumbar spine with full range of motion Straight leg elevation is negative bilaterally SI joints are mobile Lab Results  Component Value Date   WBC 4.2 10/21/2023   HGB 13.9 10/21/2023   HCT 39.9 10/21/2023   PLT 185.0 10/21/2023   GLUCOSE 90 10/21/2023   CHOL 203 (H) 10/21/2023   TRIG 39.0 10/21/2023   HDL 83.10 10/21/2023   LDLCALC 112 (H) 10/21/2023   ALT 19 10/21/2023   AST 18 10/21/2023   NA 137 10/21/2023   K 4.0 10/21/2023   CL 102 10/21/2023   CREATININE 0.53 10/21/2023   BUN 15 10/21/2023   CO2 29 10/21/2023   TSH 0.68 10/21/2023    MM 3D DIAGNOSTIC MAMMOGRAM BILATERAL BREAST Result Date: 05/02/2023 CLINICAL DATA:  43 year old female presenting for evaluation of a palpable lump in the superior right breast. There is intermittent pain associated with this lump. This is the patient's baseline exam. EXAM: DIGITAL DIAGNOSTIC BILATERAL MAMMOGRAM WITH TOMOSYNTHESIS AND CAD; ULTRASOUND LEFT BREAST LIMITED; ULTRASOUND RIGHT BREAST LIMITED TECHNIQUE: Bilateral digital diagnostic mammography and breast tomosynthesis was performed. The images were evaluated with computer-aided detection. ; Targeted ultrasound examination of the left breast was performed.; Targeted ultrasound examination of the right breast was performed COMPARISON:  None available. ACR Breast Density Category a: The breasts are almost entirely fatty. FINDINGS: A BB indicating the palpable site of concern has been placed along the superior right breast. No suspicious mammographic findings are identified deep to the palpable marker. An asymmetry in the lateral posterior left breast appears to disperse on the spot compression tomosynthesis images. No suspicious calcifications, masses or areas of distortion are seen in the bilateral breasts.  Ultrasound targeted to the palpable site in the superior right breast at approximately 1 o'clock, 10 cm from the nipple demonstrates normal fibroglandular tissue. No suspicious masses or areas of shadowing are identified. Ultrasound targeted to the lateral posterior left breast demonstrates normal fibroglandular tissue. Incidentally noted is a benign anechoic oval circumscribed cyst at 3 o'clock, 8 cm from the nipple measuring 5 mm. IMPRESSION: 1. No suspicious mammographic or targeted sonographic abnormalities are identified at the palpable site in the right breast. 2.  No evidence of malignancy in the bilateral breasts. RECOMMENDATION: 1. Clinical follow-up recommended for the palpable area of concern in the right breast. Any further workup should be based on clinical grounds. 2.  Screening mammogram in one year.(Code:SM-B-01Y) I have discussed the  findings and recommendations with the patient. If applicable, a reminder letter will be sent to the patient regarding the next appointment. BI-RADS CATEGORY  2: Benign. Electronically Signed   By: Rosaline Collet M.D.   On: 05/02/2023 16:10   US  LIMITED ULTRASOUND INCLUDING AXILLA RIGHT BREAST Result Date: 05/02/2023 CLINICAL DATA:  43 year old female presenting for evaluation of a palpable lump in the superior right breast. There is intermittent pain associated with this lump. This is the patient's baseline exam. EXAM: DIGITAL DIAGNOSTIC BILATERAL MAMMOGRAM WITH TOMOSYNTHESIS AND CAD; ULTRASOUND LEFT BREAST LIMITED; ULTRASOUND RIGHT BREAST LIMITED TECHNIQUE: Bilateral digital diagnostic mammography and breast tomosynthesis was performed. The images were evaluated with computer-aided detection. ; Targeted ultrasound examination of the left breast was performed.; Targeted ultrasound examination of the right breast was performed COMPARISON:  None available. ACR Breast Density Category a: The breasts are almost entirely fatty. FINDINGS: A BB indicating the palpable  site of concern has been placed along the superior right breast. No suspicious mammographic findings are identified deep to the palpable marker. An asymmetry in the lateral posterior left breast appears to disperse on the spot compression tomosynthesis images. No suspicious calcifications, masses or areas of distortion are seen in the bilateral breasts. Ultrasound targeted to the palpable site in the superior right breast at approximately 1 o'clock, 10 cm from the nipple demonstrates normal fibroglandular tissue. No suspicious masses or areas of shadowing are identified. Ultrasound targeted to the lateral posterior left breast demonstrates normal fibroglandular tissue. Incidentally noted is a benign anechoic oval circumscribed cyst at 3 o'clock, 8 cm from the nipple measuring 5 mm. IMPRESSION: 1. No suspicious mammographic or targeted sonographic abnormalities are identified at the palpable site in the right breast. 2.  No evidence of malignancy in the bilateral breasts. RECOMMENDATION: 1. Clinical follow-up recommended for the palpable area of concern in the right breast. Any further workup should be based on clinical grounds. 2.  Screening mammogram in one year.(Code:SM-B-01Y) I have discussed the findings and recommendations with the patient. If applicable, a reminder letter will be sent to the patient regarding the next appointment. BI-RADS CATEGORY  2: Benign. Electronically Signed   By: Rosaline Collet M.D.   On: 05/02/2023 16:10   US  LIMITED ULTRASOUND INCLUDING AXILLA LEFT BREAST  Result Date: 05/02/2023 CLINICAL DATA:  43 year old female presenting for evaluation of a palpable lump in the superior right breast. There is intermittent pain associated with this lump. This is the patient's baseline exam. EXAM: DIGITAL DIAGNOSTIC BILATERAL MAMMOGRAM WITH TOMOSYNTHESIS AND CAD; ULTRASOUND LEFT BREAST LIMITED; ULTRASOUND RIGHT BREAST LIMITED TECHNIQUE: Bilateral digital diagnostic mammography and breast  tomosynthesis was performed. The images were evaluated with computer-aided detection. ; Targeted ultrasound examination of the left breast was performed.; Targeted ultrasound examination of the right breast was performed COMPARISON:  None available. ACR Breast Density Category a: The breasts are almost entirely fatty. FINDINGS: A BB indicating the palpable site of concern has been placed along the superior right breast. No suspicious mammographic findings are identified deep to the palpable marker. An asymmetry in the lateral posterior left breast appears to disperse on the spot compression tomosynthesis images. No suspicious calcifications, masses or areas of distortion are seen in the bilateral breasts. Ultrasound targeted to the palpable site in the superior right breast at approximately 1 o'clock, 10 cm from the nipple demonstrates normal fibroglandular tissue. No suspicious masses or areas of shadowing are identified. Ultrasound targeted to the lateral posterior left breast demonstrates normal fibroglandular tissue. Incidentally noted is  a benign anechoic oval circumscribed cyst at 3 o'clock, 8 cm from the nipple measuring 5 mm. IMPRESSION: 1. No suspicious mammographic or targeted sonographic abnormalities are identified at the palpable site in the right breast. 2.  No evidence of malignancy in the bilateral breasts. RECOMMENDATION: 1. Clinical follow-up recommended for the palpable area of concern in the right breast. Any further workup should be based on clinical grounds. 2.  Screening mammogram in one year.(Code:SM-B-01Y) I have discussed the findings and recommendations with the patient. If applicable, a reminder letter will be sent to the patient regarding the next appointment. BI-RADS CATEGORY  2: Benign. Electronically Signed   By: Rosaline Collet M.D.   On: 05/02/2023 16:10    Assessment & Plan:   Problem List Items Addressed This Visit     Lipoma of back   Oval, flat, mobile, grape sized  subcutaneous tumors in the sacral iliac areas of the back sensitive to palpation. 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 Sports medicine consultation for chronic low back pain GYN consultation for pelvic pain/irregular menses -she will call      Low back pain - Primary   Likely musculoskeletal low back pain.  Oval, flat, mobile, grape sized subcutaneous tumors in the sacral iliac areas of the back sensitive to palpation. 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 Sports medicine consultation for chronic low back pain GYN consultation for pelvic pain/irregular menses -she will call Obtain lumbar spine x-ray NSAIDs were offered      Relevant Orders   Urinalysis (Completed)   DG Lumbar Spine 2-3 Views (Completed)   Ambulatory referral to Sports Medicine   Pelvic pain   Status post R IUD implant about 6 years ago C/o pelvic pain x chronic, peri-menstrual.  Reports occasional pain during intercourse and spotting after intercourse.  Occasionally would miss a period Schedule GYN appointment-she will call her gynecologist, Dr Diedre      Vitamin D  deficiency   Continue with vitamin D .         No orders of the defined types were placed in this encounter.     Follow-up: Return in 3 months (on 06/09/2024) for a follow-up visit.  Marolyn Noel, MD

## 2024-03-09 NOTE — Assessment & Plan Note (Signed)
 Status post R IUD implant about 6 years ago C/o pelvic pain x chronic, peri-menstrual.  Reports occasional pain during intercourse and spotting after intercourse.  Occasionally would miss a period Schedule GYN appointment-she will call her gynecologist, Dr Diedre

## 2024-03-09 NOTE — Assessment & Plan Note (Signed)
 Oval, flat, mobile, grape sized subcutaneous tumors in the sacral iliac areas of the back sensitive to palpation. 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 Sports medicine consultation for chronic low back pain GYN consultation for pelvic pain/irregular menses -she will call

## 2024-03-09 NOTE — Assessment & Plan Note (Signed)
 Continue with vitamin D

## 2024-03-09 NOTE — Assessment & Plan Note (Signed)
 Likely musculoskeletal low back pain.  Oval, flat, mobile, grape sized subcutaneous tumors in the sacral iliac areas of the back sensitive to palpation. 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 Sports medicine consultation for chronic low back pain GYN consultation for pelvic pain/irregular menses -she will call Obtain lumbar spine x-ray NSAIDs were offered

## 2024-03-10 ENCOUNTER — Ambulatory Visit: Payer: Self-pay | Admitting: Internal Medicine

## 2024-03-13 NOTE — Progress Notes (Unsigned)
 [facility] [address] [place] [address] Phone: [phone]   Assessment and Plan:     1. Chronic bilateral low back pain without sciatica (Primary) -Chronic with exacerbation, initial sports medicine visit - Recurrent flare of bilateral low back pain, worse on right compared to left.  Most consistent with musculoskeletal dysfunction, lumbar muscular strains.  Patient typically notices worsening pain just before her cycle, so there may be a hormonal link.  Patient also experience shingles outbreak along right low back/gluteal musculature and may be experiencing flares of postherpetic neuralgia - Start meloxicam  15 mg daily x2 weeks.  If still having pain after 2 weeks, complete 3rd-week of NSAID. May use remaining NSAID as needed once daily for pain control.  Do not to use additional over-the-counter NSAIDs (ibuprofen , naproxen, Advil , Aleve, etc.) while taking prescription NSAIDs.  May use Tylenol  [id number] mg 2 to 3 times a day for breakthrough pain. -Start HEP for low back and core - Reviewed x-ray of lumbar spine which showed no acute fracture or vertebral collapse.  Overall unremarkable imaging  15 additional minutes spent for educating Therapeutic Home Exercise Program.  This included exercises focusing on stretching, strengthening, with focus on eccentric aspects.   Long term goals include an improvement in range of motion, strength, endurance as well as avoiding reinjury. Patient's frequency would include in 1-2 times a day, 3-5 times a week for a duration of 6-12 weeks. Proper technique shown and discussed handout in great detail with ATC.  All questions were discussed and answered.    2. Lipoma of back -Chronic, initial visit - Patient experiencing mobile masses superficial to bilateral SI joints.  Consistent with lipomas, right larger than left - Discussed treatment options which would include no intervention, CSI to cause  fat atrophy and possible shrinking of lipomas, referral for surgical excision.  Patient stated she would consider options and let us  know how she would like to proceed    Pertinent previous records reviewed include lumbar x-ray   Follow Up: 4 weeks for reevaluation.  Could discuss OMT versus physical therapy, pelvic floor therapy, versus advanced imaging   Subjective:   I, [name], am serving as a neurosurgeon for Doctor Morene Mace  Chief Complaint: low back pain   HPI:   03/18/2024 Patient is a 43 year old female with low back pain. Patient states she has 2 lump low back. Thinks they are lipomas. Low back pain for a couple of weeks thinks its from her cycle. Advil  wasn't helping but the pain has resolved some. She is TTP when she presses on her two lumps. No radiating pain. No numbness or tingling.   Relevant Historical Information: None pertinent  Additional pertinent review of systems negative.   Current Outpatient Medications:    meloxicam  (MOBIC ) 15 MG tablet, Take 1 tablet daily for 2 weeks.  If still in pain after 2 weeks, take 1 tablet daily for an additional 1 week., Disp: 30 tablet, Rfl: 0   Cholecalciferol (VITAMIN D3) 50 MCG (2000 UT) capsule, Take 1 capsule (2,000 Units total) by mouth daily., Disp: 100 capsule, Rfl: 3   pantoprazole  (PROTONIX ) 40 MG tablet, Take 1 tablet (40 mg total) by mouth daily before breakfast., Disp: 30 tablet, Rfl: 6   Vitamin D , Ergocalciferol , (DRISDOL ) 1.25 MG (50000 UNIT) CAPS capsule, Take 1 capsule (50,000 Units total) by mouth every 7 (seven) days., Disp: 8 capsule, Rfl: 0   Objective:  Vitals:   03/18/24 1254  BP: 120/80  Pulse: 76  SpO2: 98%  Weight: 129 lb (58.5 kg)  Height: 5' 6 (1.676 m)      Body mass index is 20.82 kg/m.    Physical Exam:    Gen: Appears well, nad, nontoxic and pleasant Psych: Alert and oriented, appropriate mood and affect Neuro: sensation intact, strength is 5/5 in upper and lower  extremities, muscle tone wnl Skin: no susupicious lesions or rashes  Back - Normal skin, Spine with normal alignment and no deformity. Mobile, circular masses superficial to bilateral SI joints, larger on right.  Mild TTP.  No erythema, open lesion, warmth. No tenderness to vertebral process palpation.   Paraspinous muscles are not tender and without spasm NTTP gluteal musculature Straight leg raise negative Trendelenberg negative Piriformis Test negative Gait normal    Electronically signed by:  Odis Mace [facility] 1:22 PM 03/18/24

## 2024-03-18 ENCOUNTER — Ambulatory Visit: Admitting: Sports Medicine

## 2024-03-18 VITALS — BP 120/80 | HR 76 | Ht 66.0 in | Wt 129.0 lb

## 2024-03-18 DIAGNOSIS — G8929 Other chronic pain: Secondary | ICD-10-CM

## 2024-03-18 DIAGNOSIS — M545 Low back pain, unspecified: Secondary | ICD-10-CM

## 2024-03-18 DIAGNOSIS — D171 Benign lipomatous neoplasm of skin and subcutaneous tissue of trunk: Secondary | ICD-10-CM | POA: Diagnosis not present

## 2024-03-18 MED ORDER — MELOXICAM 15 MG PO TABS: ORAL_TABLET | ORAL | 0 refills | Status: AC

## 2024-03-18 NOTE — Patient Instructions (Addendum)
 Low back HEP   - Start meloxicam  15 mg daily x2 weeks.  If still having pain after 2 weeks, complete 3rd-week of NSAID. May use remaining NSAID as needed once daily for pain control.  Do not to use additional over-the-counter NSAIDs (ibuprofen , naproxen, Advil , Aleve, etc.) while taking prescription NSAIDs.  May use Tylenol  [phone] mg 2 to 3 times a day for breakthrough pain.  Call and let us  know if you want a surgical referral for lipoma removal   4 week follow up

## 2024-04-03 ENCOUNTER — Other Ambulatory Visit: Payer: Self-pay | Admitting: Obstetrics and Gynecology

## 2024-04-03 DIAGNOSIS — Z1231 Encounter for screening mammogram for malignant neoplasm of breast: Secondary | ICD-10-CM

## 2024-04-10 NOTE — Progress Notes (Deleted)
"             ° °   [name] [address] [place] [address] Phone: [phone]   Assessment and Plan:     ***    Pertinent previous records reviewed include ***   Follow Up: ***     Subjective:   I, Lyrick Lagrand, am serving as a neurosurgeon for Doctor Morene Mace   Chief Complaint: low back pain    HPI:    03/18/2024 Patient is a 43 year old female with low back pain. Patient states she has 2 lump low back. Thinks they are lipomas. Low back pain for a couple of weeks thinks its from her cycle. Advil  wasn't helping but the pain has resolved some. She is TTP when she presses on her two lumps. No radiating pain. No numbness or tingling.   04/13/2024 Patient states  Relevant Historical Information: None pertinent  Additional pertinent review of systems negative.  Current Medications[1]   Objective:     There were no vitals filed for this visit.    There is no height or weight on file to calculate BMI.    Physical Exam:    ***   Electronically signed by:  Odis Mace D.[facility] 7:33 AM 04/10/2024    [1]  Current Outpatient Medications:    Cholecalciferol (VITAMIN D3) 50 MCG (2000 UT) capsule, Take 1 capsule (2,000 Units total) by mouth daily., Disp: 100 capsule, Rfl: 3   meloxicam  (MOBIC ) 15 MG tablet, Take 1 tablet daily for 2 weeks.  If still in pain after 2 weeks, take 1 tablet daily for an additional 1 week., Disp: 30 tablet, Rfl: 0   pantoprazole  (PROTONIX ) 40 MG tablet, Take 1 tablet (40 mg total) by mouth daily before breakfast., Disp: 30 tablet, Rfl: 6   Vitamin D , Ergocalciferol , (DRISDOL ) 1.25 MG (50000 UNIT) CAPS capsule, Take 1 capsule (50,000 Units total) by mouth every 7 (seven) days., Disp: 8 capsule, Rfl: 0  "

## 2024-04-13 ENCOUNTER — Ambulatory Visit: Admitting: Sports Medicine

## 2024-04-21 NOTE — Progress Notes (Signed)
 "               Odis Mace D.[facility] [address] [place] [address] Phone: [phone]   Assessment and Plan:     1. Chronic bilateral low back pain without sciatica (Primary) -Chronic with exacerbation, subsequent visit - Overall significant improvement in symptoms with HEP, meloxicam  course.  Pain was likely multifactorial including musculoskeletal strain, with potential hormonal link as pain worsens at the beginning of patient's cycle, and possible postherpetic neuralgia after experiencing shingles outbreak - Use meloxicam  15 mg daily as needed for breakthrough pain.  Recommend limiting chronic NSAIDs to 1-2 doses per week to prevent long-term side effects. Use Tylenol  500 to 1000 mg tablets 2-3 times a day as needed for day-to-day pain relief.    - Continue HEP for low back and core  2. Lipoma of back -Chronic, subsequent visit - Patient would like referral to general surgeon to discuss possible surgical excision of multiple low back lipomas.  Referral sent    Pertinent previous records reviewed include none   Follow Up: As needed if no improvement or worsening of symptoms.  Could consider OMT versus physical therapy versus pelvic floor therapy versus MRI versus repeat NSAID course   Subjective:   I, Moenique Parris, am serving as a neurosurgeon for Doctor Morene Mace   Chief Complaint: low back pain    HPI:    03/18/2024 Patient is a 43 year old female with low back pain. Patient states she has 2 lump low back. Thinks they are lipomas. Low back pain for a couple of weeks thinks its from her cycle. Advil  wasn't helping but the pain has resolved some. She is TTP when she presses on her two lumps. No radiating pain. No numbness or tingling.   1/5/206 Patient states she is better   Relevant Historical Information: None pertinent  Additional pertinent review of systems negative.  Current Medications[1]   Objective:     Vitals:   04/27/24  1435  BP: 108/78  Pulse: 85  Weight: 131 lb (59.4 kg)  Height: 5' 6 (1.676 m)      Body mass index is 21.14 kg/m.    Physical Exam:    Gen: Appears well, nad, nontoxic and pleasant Psych: Alert and oriented, appropriate mood and affect Neuro: sensation intact, strength is 5/5 in upper and lower extremities, muscle tone wnl Skin: no susupicious lesions or rashes   Back - Normal skin, Spine with normal alignment and no deformity. Mobile, circular masses superficial to bilateral SI joints, larger on right.  Mild TTP.  No erythema, open lesion, warmth. No tenderness to vertebral process palpation.   Paraspinous muscles are not tender and without spasm NTTP gluteal musculature Straight leg raise negative Trendelenberg negative Piriformis Test negative Gait normal     Electronically signed by:  Odis Mace D.[facility] 2:46 PM 04/27/2024     [1]  Current Outpatient Medications:    Cholecalciferol (VITAMIN D3) 50 MCG (2000 UT) capsule, Take 1 capsule (2,000 Units total) by mouth daily., Disp: 100 capsule, Rfl: 3   meloxicam  (MOBIC ) 15 MG tablet, Take 1 tablet daily for 2 weeks.  If still in pain after 2 weeks, take 1 tablet daily for an additional 1 week., Disp: 30 tablet, Rfl: 0   pantoprazole  (PROTONIX ) 40 MG tablet, Take 1 tablet (40 mg total) by mouth daily before breakfast., Disp: 30 tablet, Rfl: 6   Vitamin D , Ergocalciferol , (DRISDOL ) 1.25  MG (50000 UNIT) CAPS capsule, Take 1 capsule (50,000 Units total) by mouth every 7 (seven) days., Disp: 8 capsule, Rfl: 0  "

## 2024-04-27 ENCOUNTER — Ambulatory Visit (INDEPENDENT_AMBULATORY_CARE_PROVIDER_SITE_OTHER): Admitting: Sports Medicine

## 2024-04-27 VITALS — BP 108/78 | HR 85 | Ht 66.0 in | Wt 131.0 lb

## 2024-04-27 DIAGNOSIS — G8929 Other chronic pain: Secondary | ICD-10-CM

## 2024-04-27 DIAGNOSIS — M545 Low back pain, unspecified: Secondary | ICD-10-CM | POA: Diagnosis not present

## 2024-04-27 DIAGNOSIS — D171 Benign lipomatous neoplasm of skin and subcutaneous tissue of trunk: Secondary | ICD-10-CM

## 2024-04-27 NOTE — Patient Instructions (Addendum)
-   Use meloxicam  15 mg daily as needed for breakthrough pain.  Recommend limiting chronic NSAIDs to 1-2 doses per week to prevent long-term side effects. Use Tylenol  500 to 1000 mg tablets 2-3 times a day as needed for day-to-day pain relief.    Continue HEP   Surgical referral   As needed follow up

## 2024-05-04 ENCOUNTER — Ambulatory Visit
Admission: RE | Admit: 2024-05-04 | Discharge: 2024-05-04 | Disposition: A | Source: Ambulatory Visit | Attending: Obstetrics and Gynecology

## 2024-05-04 DIAGNOSIS — Z1231 Encounter for screening mammogram for malignant neoplasm of breast: Secondary | ICD-10-CM

## 2024-05-06 ENCOUNTER — Other Ambulatory Visit: Payer: Self-pay | Admitting: Obstetrics and Gynecology

## 2024-05-06 DIAGNOSIS — N63 Unspecified lump in unspecified breast: Secondary | ICD-10-CM

## 2024-05-14 ENCOUNTER — Ambulatory Visit
Admission: RE | Admit: 2024-05-14 | Discharge: 2024-05-14 | Disposition: A | Discharge status: Home / Self Care | Source: Ambulatory Visit | Attending: Obstetrics and Gynecology | Admitting: Obstetrics and Gynecology

## 2024-05-14 DIAGNOSIS — N63 Unspecified lump in unspecified breast: Secondary | ICD-10-CM

## 2024-10-21 ENCOUNTER — Encounter: Admitting: Internal Medicine
